# Patient Record
Sex: Male | Born: 1960 | Race: Black or African American | Hispanic: No
Health system: Southern US, Community
[De-identification: ages and names within clinical notes are randomized; demographics above are authoritative.]

## PROBLEM LIST (undated history)

## (undated) DIAGNOSIS — J3089 Other allergic rhinitis: Secondary | ICD-10-CM

## (undated) DIAGNOSIS — E119 Type 2 diabetes mellitus without complications: Secondary | ICD-10-CM

## (undated) DIAGNOSIS — I10 Essential (primary) hypertension: Secondary | ICD-10-CM

## (undated) DIAGNOSIS — K5792 Diverticulitis of intestine, part unspecified, without perforation or abscess without bleeding: Secondary | ICD-10-CM

## (undated) DIAGNOSIS — T7840XA Allergy, unspecified, initial encounter: Secondary | ICD-10-CM

## (undated) DIAGNOSIS — G629 Polyneuropathy, unspecified: Secondary | ICD-10-CM

## (undated) DIAGNOSIS — Z9289 Personal history of other medical treatment: Secondary | ICD-10-CM

## (undated) DIAGNOSIS — E7800 Pure hypercholesterolemia, unspecified: Secondary | ICD-10-CM

## (undated) HISTORY — DX: Type 2 diabetes mellitus without complications: E11.9

## (undated) HISTORY — DX: Diverticulitis of intestine, part unspecified, without perforation or abscess without bleeding: K57.92

## (undated) HISTORY — DX: Other allergic rhinitis: J30.89

## (undated) HISTORY — DX: Essential (primary) hypertension: I10

## (undated) HISTORY — DX: Pure hypercholesterolemia, unspecified: E78.00

## (undated) HISTORY — DX: Polyneuropathy, unspecified: G62.9

## (undated) HISTORY — DX: Allergy, unspecified, initial encounter: T78.40XA

## (undated) HISTORY — DX: Personal history of other medical treatment: Z92.89

---

## 2004-01-29 HISTORY — PX: OTHER SURGICAL HISTORY: SHX169

## 2015-08-16 ENCOUNTER — Encounter: Payer: Self-pay | Admitting: Family

## 2015-08-16 ENCOUNTER — Other Ambulatory Visit (INDEPENDENT_AMBULATORY_CARE_PROVIDER_SITE_OTHER): Payer: Commercial Managed Care - HMO

## 2015-08-16 ENCOUNTER — Other Ambulatory Visit: Payer: Commercial Managed Care - HMO

## 2015-08-16 ENCOUNTER — Ambulatory Visit (INDEPENDENT_AMBULATORY_CARE_PROVIDER_SITE_OTHER): Payer: Commercial Managed Care - HMO | Admitting: Family

## 2015-08-16 VITALS — BP 170/104 | HR 76 | Temp 98.1°F | Ht 70.5 in | Wt 224.0 lb

## 2015-08-16 DIAGNOSIS — Z9189 Other specified personal risk factors, not elsewhere classified: Secondary | ICD-10-CM | POA: Diagnosis not present

## 2015-08-16 DIAGNOSIS — E785 Hyperlipidemia, unspecified: Secondary | ICD-10-CM | POA: Diagnosis not present

## 2015-08-16 DIAGNOSIS — Z23 Encounter for immunization: Secondary | ICD-10-CM

## 2015-08-16 DIAGNOSIS — E1142 Type 2 diabetes mellitus with diabetic polyneuropathy: Secondary | ICD-10-CM

## 2015-08-16 DIAGNOSIS — Z0000 Encounter for general adult medical examination without abnormal findings: Secondary | ICD-10-CM | POA: Diagnosis not present

## 2015-08-16 DIAGNOSIS — E119 Type 2 diabetes mellitus without complications: Secondary | ICD-10-CM | POA: Insufficient documentation

## 2015-08-16 DIAGNOSIS — Z7289 Other problems related to lifestyle: Secondary | ICD-10-CM

## 2015-08-16 DIAGNOSIS — E1149 Type 2 diabetes mellitus with other diabetic neurological complication: Secondary | ICD-10-CM | POA: Diagnosis not present

## 2015-08-16 DIAGNOSIS — I10 Essential (primary) hypertension: Secondary | ICD-10-CM | POA: Insufficient documentation

## 2015-08-16 DIAGNOSIS — H6123 Impacted cerumen, bilateral: Secondary | ICD-10-CM | POA: Diagnosis not present

## 2015-08-16 DIAGNOSIS — Z125 Encounter for screening for malignant neoplasm of prostate: Secondary | ICD-10-CM

## 2015-08-16 DIAGNOSIS — Z794 Long term (current) use of insulin: Secondary | ICD-10-CM

## 2015-08-16 DIAGNOSIS — Z0001 Encounter for general adult medical examination with abnormal findings: Secondary | ICD-10-CM | POA: Diagnosis not present

## 2015-08-16 DIAGNOSIS — E669 Obesity, unspecified: Secondary | ICD-10-CM

## 2015-08-16 DIAGNOSIS — R6889 Other general symptoms and signs: Secondary | ICD-10-CM

## 2015-08-16 LAB — MICROALBUMIN / CREATININE URINE RATIO
Creatinine,U: 153.3 mg/dL
Microalb Creat Ratio: 1.2 mg/g (ref 0.0–30.0)
Microalb, Ur: 1.9 mg/dL (ref 0.0–1.9)

## 2015-08-16 LAB — CBC
HEMATOCRIT: 41 % (ref 39.0–52.0)
HEMOGLOBIN: 13.6 g/dL (ref 13.0–17.0)
MCHC: 33.3 g/dL (ref 30.0–36.0)
MCV: 80.2 fl (ref 78.0–100.0)
Platelets: 241 10*3/uL (ref 150.0–400.0)
RBC: 5.11 Mil/uL (ref 4.22–5.81)
RDW: 14.6 % (ref 11.5–15.5)
WBC: 7.3 10*3/uL (ref 4.0–10.5)

## 2015-08-16 LAB — COMPREHENSIVE METABOLIC PANEL
ALBUMIN: 4.3 g/dL (ref 3.5–5.2)
ALK PHOS: 90 U/L (ref 39–117)
ALT: 15 U/L (ref 0–53)
AST: 14 U/L (ref 0–37)
BILIRUBIN TOTAL: 0.4 mg/dL (ref 0.2–1.2)
BUN: 19 mg/dL (ref 6–23)
CALCIUM: 10.4 mg/dL (ref 8.4–10.5)
CO2: 26 mEq/L (ref 19–32)
Chloride: 106 mEq/L (ref 96–112)
Creatinine, Ser: 1.03 mg/dL (ref 0.40–1.50)
GFR: 96.1 mL/min (ref >60.00)
Glucose, Bld: 148 mg/dL — ABNORMAL HIGH (ref 70–99)
POTASSIUM: 4.1 meq/L (ref 3.5–5.1)
Sodium: 141 mEq/L (ref 135–145)
TOTAL PROTEIN: 7.1 g/dL (ref 6.0–8.3)

## 2015-08-16 LAB — LIPID PANEL
CHOLESTEROL: 153 mg/dL (ref 0–200)
HDL: 34 mg/dL — ABNORMAL LOW (ref >39.00)
LDL Cholesterol: 94 mg/dL (ref 0–99)
NONHDL: 118.74
Total CHOL/HDL Ratio: 4
Triglycerides: 122 mg/dL (ref 0.0–149.0)
VLDL: 24.4 mg/dL (ref 0.0–40.0)

## 2015-08-16 LAB — PSA: PSA: 1.12 ng/mL (ref 0.10–4.00)

## 2015-08-16 NOTE — Assessment & Plan Note (Signed)
Impacted cerumen of bilateral ears noted. Ears are cleansed with docusate sodium and flushed with warm water with wax return noted. Symptoms worse improved upon completion with no complications and procedure was tolerated well.

## 2015-08-16 NOTE — Progress Notes (Signed)
Subjective:    Patient ID: John Dalton, male    DOB: [birth year], 55 y.o.   MRN: [id number]  Chief Complaint  Patient presents with  . Establish Care    colonoscopy in 2006 in [place]. td done 15 years ago. Eye exam done May 2017 in [place]    HPI:  John Dalton is a 55 y.o. male who presents today for an annual wellness visit.   1) Health Maintenance -   Diet - Averages about 2-3 meals per day consisting of a regular diet; some processed and sugary foods; No caffeine intake  Exercise - Walking regularly  2) Preventative Exams / Immunizations:  Dental -- Due for exam   Vision -- Up to date   Health Maintenance  Topic Date Due  . HEMOGLOBIN A1C  05-15-1960  . Hepatitis C Screening  [birth year]  . FOOT EXAM  09/10/1969  . HIV Screening  09/11/1974  . TETANUS/TDAP  09/11/1978  . COLONOSCOPY  09/10/2009  . PNEUMOCOCCAL POLYSACCHARIDE VACCINE (1) 07/29/2016 (Originally 09/10/1961)  . INFLUENZA VACCINE  08/29/2015  . OPHTHALMOLOGY EXAM  05/28/2016     Immunization History  Administered Date(s) Administered  . Tdap 08/16/2015    RISK FACTORS  Tobacco History  Smoking status  . Current Some Day Smoker -- 0.50 packs/day for 20 years  . Types: Cigarettes  Smokeless tobacco  . Never Used     Cardiac risk factors: advanced age (older than 96 for men, 88 for women), diabetes mellitus, dyslipidemia, hypertension and obesity (BMI >= 30 kg/m2).  Depression Screen  Q1: Over the past two weeks, have you felt down, depressed or hopeless? No  Q2: Over the past two weeks, have you felt little interest or pleasure in doing things? No  Have you lost interest or pleasure in daily life? No  Do you often feel hopeless? No  Do you cry easily over simple problems? No  Activities of Daily Living In your present state of health, do you have any difficulty performing the following activities?:  Driving? No Managing money?  No Feeding yourself? No Getting from bed to chair?  No Climbing a flight of stairs? No Preparing food and eating?: No Bathing or showering? No Getting dressed: No Getting to the toilet? No Using the toilet: No Moving around from place to place: No In the past year have you fallen or had a near fall?:No   Home Safety Has smoke detector and wears seat belts.  No excess sun exposure. Are there smokers in your home (other than you)?  No Do you feel safe at home?  Yes  Hearing Difficulties: No Do you often ask people to speak up or repeat themselves? No Do you experience ringing or noises in your ears? No  Do you have difficulty understanding soft or whispered voices? No    Cognitive Testing  Alert? Yes   Normal Appearance? Yes  Oriented to person? Yes  Place? Yes   Time? Yes  Recall of three objects?  Yes  Can perform simple calculations? Yes  Displays appropriate judgment? Yes  Can read the correct time from a watch face? Yes  Do you feel that you have a problem with memory? No  Do you often misplace items? No   Advanced Directives have been discussed with the patient? Yes  Current Physicians/Providers and Suppliers  1.  Marcos Eke, FNP - Internal Medicine  Indicate any recent Medical Services you may have received from other than [facility] providers in the past year (date  may be approximate).  All answers were reviewed with the patient and necessary referrals were made:  Jeanine Luz, FNP   08/16/2015    No Known Allergies   No outpatient prescriptions prior to visit.   No facility-administered medications prior to visit.     Past Medical History  Diagnosis Date  . Diabetes mellitus (HCC)   . Diverticulitis   . Environmental and seasonal allergies   . HTN (hypertension)   . Hypercholesteremia   . 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 Allergy      Past Surgical History  Procedure Laterality Date  . Colon resection       Family History  Problem Relation Age of  Onset  . Diabetes Father   . Hypertension Mother   . Hypertension Sister   . Diabetes Paternal Grandmother   . Diabetes Paternal Grandfather   . Hypertension Maternal Grandmother   . Hypertension Maternal Grandfather   . Hypertension Paternal Grandmother   . Hypertension Paternal Grandfather      Social History   Social History  . Marital Status: Married    Spouse Name: N/A  . Number of Children: 9  . Years of Education: 16   Occupational History  . Retired    Social History Main Topics  . Smoking status: Current Some Day Smoker -- 0.50 packs/day for 20 years    Types: Cigarettes  . Smokeless tobacco: Never Used  . Alcohol Use: No  . Drug Use: No  . Sexual Activity:    Partners: Female   Other Topics Concern  . Not on file   Social History Narrative   Fun: Sports, reading and walking     Review of Systems  Constitutional: Denies fever, chills, fatigue, or significant weight gain/loss. HENT: Head: Denies headache or neck pain Ears: Denies changes in hearing, ringing in ears, earache, drainage Nose: Denies discharge, stuffiness, itching, nosebleed, sinus pain Throat: Denies sore throat, hoarseness, dry mouth, sores, thrush Eyes: Denies loss/changes in vision, pain, redness, blurry/double vision, flashing lights Cardiovascular: Denies chest pain/discomfort, tightness, palpitations, shortness of breath with activity, difficulty lying down, swelling, sudden awakening with shortness of breath Respiratory: Denies shortness of breath, cough, sputum production, wheezing Gastrointestinal: Denies dysphasia, heartburn, change in appetite, nausea, change in bowel habits, rectal bleeding, constipation, diarrhea, yellow skin or eyes Genitourinary: Denies frequency, urgency, burning/pain, blood in urine, incontinence, change in urinary strength. Musculoskeletal: Denies muscle/joint pain, stiffness, back pain, redness or swelling of joints, trauma Skin: Denies rashes, lumps,  itching, dryness, color changes, or hair/nail changes Neurological: Denies dizziness, fainting, seizures, weakness, numbness, tingling, tremor Psychiatric - Denies nervousness, stress, depression or memory loss Endocrine: Denies heat or cold intolerance, sweating, frequent urination, excessive thirst, changes in appetite Hematologic: Denies ease of bruising or bleeding    Objective:     BP 170/104 mmHg  Pulse 76  Temp(Src) 98.1 F (36.7 C) (Oral)  Ht 5' 10.5" (1.791 m)  Wt 224 lb (101.606 kg)  BMI 31.68 kg/m2  SpO2 97% Nursing note and vital signs reviewed.  Physical Exam  Constitutional: He is oriented to person, place, and time. He appears well-developed and well-nourished.  HENT:  Head: Normocephalic.  Right Ear: Hearing, tympanic membrane, external ear and ear canal normal.  Left Ear: Hearing, tympanic membrane, external ear and ear canal normal.  Nose: Nose normal.  Mouth/Throat: Uvula is midline, oropharynx is clear and moist and mucous membranes are normal.  Impacted  cerumen noted in bilateral ears.  Eyes: Conjunctivae and EOM are normal. Pupils are equal, round, and reactive to light.  Neck: Neck supple. No JVD present. No tracheal deviation present. No thyromegaly present.  Cardiovascular: Normal rate, regular rhythm, normal heart sounds and intact distal pulses.   Pulmonary/Chest: Effort normal and breath sounds normal.  Abdominal: Soft. Bowel sounds are normal. He exhibits no distension and no mass. There is no tenderness. There is no rebound and no guarding.  Musculoskeletal: Normal range of motion. He exhibits no edema or tenderness.  Lymphadenopathy:    He has no cervical adenopathy.  Neurological: He is alert and oriented to person, place, and time. He has normal reflexes. No cranial nerve deficit. He exhibits normal muscle tone. Coordination normal.  Diabetic Foot Exam - Simple   Simple Foot Form  Diabetic Foot exam was performed with the following findings:   Yes  08/16/2015 10:05 AM  Visual Inspection  No deformities, no ulcerations, no other skin breakdown bilaterally:  Yes  Sensation Testing  Intact to touch and monofilament testing bilaterally:  Yes  Pulse Check  Posterior Tibialis and Dorsalis pulse intact bilaterally:  Yes   Skin: Skin is warm and dry.  Psychiatric: He has a normal mood and affect. His behavior is normal. Judgment and thought content normal.       Assessment & Plan:   During the course of the visit the patient was educated and counseled about appropriate screening and preventive services including:    Pneumococcal vaccine   Influenza vaccine  Td vaccine  Prostate cancer screening  Colorectal cancer screening  Diabetes screening  Nutrition counseling   Diet review for nutrition referral? Yes ____  Not Indicated _X___   Patient Instructions (the written plan) was given to the patient.  Medicare Attestation I have personally reviewed: The patient's medical and social history Their use of alcohol, tobacco or illicit drugs Their current medications and supplements The patient's functional ability including ADLs,fall risks, home safety risks, cognitive, and hearing and visual impairment Diet and physical activities Evidence for depression or mood disorders  The patient's weight, height, BMI,  have been recorded in the chart.  I have made referrals, counseling, and provided education to the patient based on review of the above and I have provided the patient with a written personalized care plan for preventive services.     Jeanine Luz, FNP   08/16/2015    Problem List Items Addressed This Visit      Cardiovascular and Mediastinum   Hypertension, essential    Hypertension remains elevated above goal 140/90 with current regimen and no adverse side effects. No symptoms of end organ damage or worse headache of life. Encouraged to follow a low-sodium diet. Continue current dosage of amlodipine and  lisinopril. Recheck blood pressure in 2 weeks.      Relevant Medications   amLODipine (NORVASC) 10 MG tablet   atenolol (TENORMIN) 50 MG tablet   lisinopril (PRINIVIL,ZESTRIL) 40 MG tablet   simvastatin (ZOCOR) 20 MG tablet   aspirin EC 81 MG tablet     Endocrine   Type 2 diabetes mellitus (HCC)    Type 2 diabetes appears uncontrolled with current blood sugars ranging above goal with current regimen. Obtain A1c and urine microalbumin. Diabetic foot exam completed today. Continue current dosage of Lantus, metformin, and pH let his own. Maintained on lisinopril and simvastatin for CAD risk reduction. Diabetic eye exam is up-to-date. Follow-up in one month or sooner if needed.  Relevant Medications   LANTUS SOLOSTAR 100 UNIT/ML Solostar Pen   pioglitazone (ACTOS) 30 MG tablet   lisinopril (PRINIVIL,ZESTRIL) 40 MG tablet   simvastatin (ZOCOR) 20 MG tablet   metFORMIN (GLUCOPHAGE) 1000 MG tablet   aspirin EC 81 MG tablet   Other Relevant Orders   Urine Microalbumin w/creat. ratio     Nervous and Auditory   Impacted cerumen of both ears    Impacted cerumen of bilateral ears noted. Ears are cleansed with docusate sodium and flushed with warm water with wax return noted. Symptoms worse improved upon completion with no complications and procedure was tolerated well.        Other   Encounter for routine adult medical exam with abnormal findings    1) Anticipatory Guidance: Discussed importance of wearing a seatbelt while driving and not texting while driving; changing batteries in smoke detector at least once annually; wearing suntan lotion when outside; eating a balanced and moderate diet; getting physical activity at least 30 minutes per day.  2) Immunizations / Screenings / Labs:  Tetanus shot updated today. Declines pneumonia vaccination. All other immunizations are up-to-date per recommendations. Obtain hepatitis C antibody for hepatitis C screening. Obtain PSA for prostate  cancer screening. Obtain A1c and microalbumin. Obtain CBC, CMET, andl lipid profile   Overall well exam with risk factors for cardiovascular disease including type 2 diabetes, hypertension, obesity, and hyperlipidemia. Chronic conditions are currently managed through medication. Continue other healthy lifestyle behaviors and choices. Follow-up prevention exam in 1 year. Follow-up office visit for chronic conditions pending blood work.      Relevant Orders   PSA (Completed)   CBC (Completed)   Comprehensive metabolic panel (Completed)   Lipid panel (Completed)   Medicare annual wellness visit, subsequent - Primary    Reviewed and updated patient's medical, surgical, family and social history. Medications and allergies were also reviewed. Basic screenings for depression, activities of daily living, hearing, cognition and safety were performed. Provider list was updated and health plan was provided to the patient.       Hyperlipidemia    Appears stable and currently maintained on simvastatin. Obtain lipid profile. Continue current dosage of simvastatin pending lipid profile results.      Relevant Medications   amLODipine (NORVASC) 10 MG tablet   atenolol (TENORMIN) 50 MG tablet   lisinopril (PRINIVIL,ZESTRIL) 40 MG tablet   simvastatin (ZOCOR) 20 MG tablet   aspirin EC 81 MG tablet   Obesity    BMI of 31.68. Recommend weight loss of 5-10% of current body weight through nutrition and physical activity.Recommend increasing physical activity to 30 minutes of moderate level activity daily. Encourage nutritional intake that focuses on nutrient dense foods and is moderate, varied, and balanced and is low in saturated fats and processed/sugary foods. Continue to monitor.      Relevant Medications   LANTUS SOLOSTAR 100 UNIT/ML Solostar Pen   pioglitazone (ACTOS) 30 MG tablet   metFORMIN (GLUCOPHAGE) 1000 MG tablet    Other Visit Diagnoses    At risk for colon cancer        Other problems  related to lifestyle        Relevant Orders    Hepatitis C antibody    Need for prophylactic vaccination with combined diphtheria-tetanus-pertussis (DTP) vaccine        Relevant Orders    Tdap vaccine greater than or equal to 7yo IM (Completed)

## 2015-08-16 NOTE — Assessment & Plan Note (Signed)
BMI of 31.68. Recommend weight loss of 5-10% of current body weight through nutrition and physical activity.Recommend increasing physical activity to 30 minutes of moderate level activity daily. Encourage nutritional intake that focuses on nutrient dense foods and is moderate, varied, and balanced and is low in saturated fats and processed/sugary foods. Continue to monitor.

## 2015-08-16 NOTE — Assessment & Plan Note (Addendum)
Hypertension remains elevated above goal 140/90 with current regimen and no adverse side effects. No symptoms of end organ damage or worse headache of life. Encouraged to follow a low-sodium diet. Continue current dosage of amlodipine and lisinopril. Recheck blood pressure in 2 weeks.

## 2015-08-16 NOTE — Progress Notes (Signed)
Pre visit review using our clinic review tool, if applicable. No additional management support is needed unless otherwise documented below in the visit note. 

## 2015-08-16 NOTE — Assessment & Plan Note (Signed)
Appears stable and currently maintained on simvastatin. Obtain lipid profile. Continue current dosage of simvastatin pending lipid profile results.

## 2015-08-16 NOTE — Assessment & Plan Note (Signed)
1) Anticipatory Guidance: Discussed importance of wearing a seatbelt while driving and not texting while driving; changing batteries in smoke detector at least once annually; wearing suntan lotion when outside; eating a balanced and moderate diet; getting physical activity at least 30 minutes per day.  2) Immunizations / Screenings / Labs:  Tetanus shot updated today. Declines pneumonia vaccination. All other immunizations are up-to-date per recommendations. Obtain hepatitis C antibody for hepatitis C screening. Obtain PSA for prostate cancer screening. Obtain A1c and microalbumin. Obtain CBC, CMET, andl lipid profile   Overall well exam with risk factors for cardiovascular disease including type 2 diabetes, hypertension, obesity, and hyperlipidemia. Chronic conditions are currently managed through medication. Continue other healthy lifestyle behaviors and choices. Follow-up prevention exam in 1 year. Follow-up office visit for chronic conditions pending blood work.

## 2015-08-16 NOTE — Assessment & Plan Note (Signed)
Type 2 diabetes appears uncontrolled with current blood sugars ranging above goal with current regimen. Obtain A1c and urine microalbumin. Diabetic foot exam completed today. Continue current dosage of Lantus, metformin, and pH let his own. Maintained on lisinopril and simvastatin for CAD risk reduction. Diabetic eye exam is up-to-date. Follow-up in one month or sooner if needed.

## 2015-08-16 NOTE — Progress Notes (Deleted)
Subjective:    Patient ID: John Dalton, male    DOB: 1960/11/16, 55 y.o.   MRN: [id number]  Chief Complaint  Patient presents with  . Establish Care    colonoscopy in 2006 in [place]. td done 15 years ago. Eye exam done May 2017 in [place]    HPI:  John Dalton is a 55 y.o. male who presents today for an annual wellness visit.   1) Health Maintenance -   Diet -   Exercise -  2) Preventative Exams / Immunizations:  Dental --  Vision --   Health Maintenance  Topic Date Due  . Hepatitis C Screening  01961/10/19  . HIV Screening  09/11/1974  . TETANUS/TDAP  09/11/1978  . COLONOSCOPY  09/10/2009  . INFLUENZA VACCINE  08/29/2015      There is no immunization history on file for this patient.  RISK FACTORS  Tobacco History  Smoking status  . Current Some Day Smoker -- 0.50 packs/day  . Types: Cigarettes  Smokeless tobacco  . Not on file     Cardiac risk factors: {risk factors:510}.  Depression Screen  Q1: Over the past two weeks, have you felt down, depressed or hopeless? No  Q2: Over the past two weeks, have you felt little interest or pleasure in doing things? No  Have you lost interest or pleasure in daily life? No  Do you often feel hopeless? No  Do you cry easily over simple problems? No  Activities of Daily Living In your present state of health, do you have any difficulty performing the following activities?:  Driving? No Managing money?  No Feeding yourself? No Getting from bed to chair? No Climbing a flight of stairs? No Preparing food and eating?: No Bathing or showering? No Getting dressed: No Getting to the toilet? No Using the toilet: No Moving around from place to place: No In the past year have you fallen or had a near fall?:No   Home Safety Has smoke detector and wears seat belts. No firearms. No excess sun exposure. Are there smokers in your home (other than you)?  No Do you feel safe at home?  Yes  Hearing Difficulties: No Do  you often ask people to speak up or repeat themselves? No Do you experience ringing or noises in your ears? No  Do you have difficulty understanding soft or whispered voices? No    Cognitive Testing  Alert? Yes   Normal Appearance? Yes  Oriented to person? Yes  Place? Yes   Time? Yes  Recall of three objects?  Yes  Can perform simple calculations? Yes  Displays appropriate judgment? Yes  Can read the correct time from a watch face? Yes  Do you feel that you have a problem with memory? No  Do you often misplace items? No   Advanced Directives have been discussed with the patient? {yes/no:20286}  Current Physicians/Providers and Suppliers  1.   Indicate any recent Medical Services you may have received from other than [facility] providers in the past year (date may be approximate).  All answers were reviewed with the patient and necessary referrals were made:  Jeanine LuzCalone, Gregory, FNP   08/16/2015    Not on File   No outpatient prescriptions prior to visit.   No facility-administered medications prior to visit.     Past Medical History  Diagnosis Date  . Diabetes mellitus (HCC)   . Diverticulitis   . Environmental and seasonal allergies   . HTN (hypertension)   . Hypercholesteremia   .  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   Family History  Problem Relation Age of Onset  . Diabetes Father   . Hypertension Mother   . Hypertension Sister   . Diabetes Paternal Grandmother   . Diabetes Paternal Grandfather   . Hypertension Maternal Grandmother   . Hypertension Maternal Grandfather   . Hypertension Paternal Grandmother   . Hypertension Paternal Grandfather      Social History   Social History  . Marital Status: Married    Spouse Name: N/A  . Number of Children: N/A  . Years of Education: N/A   Occupational History  . Not on file.   Social History Main Topics  . Smoking status: Current  Some Day Smoker -- 0.50 packs/day    Types: Cigarettes  . Smokeless tobacco: Not on file  . Alcohol Use: No  . Drug Use: No  . Sexual Activity:    Partners: Female   Other Topics Concern  . Not on file   Social History Narrative  . No narrative on file     Review of Systems  Constitutional: Denies fever, chills, fatigue, or significant weight gain/loss. HENT: Head: Denies headache or neck pain Ears: Denies changes in hearing, ringing in ears, earache, drainage Nose: Denies discharge, stuffiness, itching, nosebleed, sinus pain Throat: Denies sore throat, hoarseness, dry mouth, sores, thrush Eyes: Denies loss/changes in vision, pain, redness, blurry/double vision, flashing lights Cardiovascular: Denies chest pain/discomfort, tightness, palpitations, shortness of breath with activity, difficulty lying down, swelling, sudden awakening with shortness of breath Respiratory: Denies shortness of breath, cough, sputum production, wheezing Gastrointestinal: Denies dysphasia, heartburn, change in appetite, nausea, change in bowel habits, rectal bleeding, constipation, diarrhea, yellow skin or eyes Genitourinary: Denies frequency, urgency, burning/pain, blood in urine, incontinence, change in urinary strength. Musculoskeletal: Denies muscle/joint pain, stiffness, back pain, redness or swelling of joints, trauma Skin: Denies rashes, lumps, itching, dryness, color changes, or hair/nail changes Neurological: Denies dizziness, fainting, seizures, weakness, numbness, tingling, tremor Psychiatric - Denies nervousness, stress, depression or memory loss Endocrine: Denies heat or cold intolerance, sweating, frequent urination, excessive thirst, changes in appetite Hematologic: Denies ease of bruising or bleeding    Objective:     BP 170/104 mmHg  Pulse 76  Temp(Src) 98.1 F (36.7 C) (Oral)  Ht 5' 10.5" (1.791 m)  Wt 224 lb (101.606 kg)  BMI 31.68 kg/m2  SpO2 97% Nursing note and vital signs  reviewed.  Physical Exam     Assessment & Plan:   During the course of the visit the patient was educated and counseled about appropriate screening and preventive services including:    {plan:19837}  Diet review for nutrition referral? Yes ____  Not Indicated _X___   Patient Instructions (the written plan) was given to the patient.  Medicare Attestation I have personally reviewed: The patient's medical and social history Their use of alcohol, tobacco or illicit drugs Their current medications and supplements The patient's functional ability including ADLs,fall risks, home safety risks, cognitive, and hearing and visual impairment Diet and physical activities Evidence for depression or mood disorders  The patient's weight, height, BMI,  have been recorded in the chart.  I have made referrals, counseling, and provided education to the patient based on review of the above and I have provided the patient with a written personalized care plan for preventive services.     Jeanine Luz, FNP  08/16/2015    Problem List Items Addressed This Visit    None

## 2015-08-16 NOTE — Assessment & Plan Note (Signed)
Reviewed and updated patient's medical, surgical, family and social history. Medications and allergies were also reviewed. Basic screenings for depression, activities of daily living, hearing, cognition and safety were performed. Provider list was updated and health plan was provided to the patient.  

## 2015-08-16 NOTE — Patient Instructions (Addendum)
Thank you for choosing [facility].  Summary/Instructions:  Please follow up pending A1c results to discuss medications.   Please stop by the lab on the lower level of the building for your blood work. Your results will be released to MyChart (or called to you) after review, usually within 72 hours after test completion. If any changes need to be made, you will be notified at that same time.  1. The lab is open from 7:30am to 5:30 pm Monday-Friday  2. No appointment is necessary  3. Fasting (if needed) is 6-8 hours after food and drink; black coffee  and water are okay    Advance Directive Advance directives are the legal documents that allow you to make choices about your health care and medical treatment if you cannot speak for yourself. Advance directives are a way for you to communicate your wishes to family, friends, and health care providers. The specified people can then convey your decisions about end-of-life care to avoid confusion if you should become unable to communicate. Ideally, the process of discussing and writing advance directives should happen over time rather than making decisions all at once. Advance directives can be modified as your situation changes, and you can change your mind at any time, even after you have signed the advance directives. Each state has its own laws regarding advance directives. You may want to check with your health care provider, attorney, or state representative about the law in your state. Below are some examples of advance directives. LIVING WILL A living will is a set of instructions documenting your wishes about medical care when you cannot care for yourself. It is used if you become:  Terminally ill.  Incapacitated.  Unable to communicate.  Unable to make decisions. Items to consider in your living will include:  The use or non-use of life-sustaining equipment, such as dialysis machines and breathing machines (ventilators).  A  do not resuscitate (DNR) order, which is the instruction not to use cardiopulmonary resuscitation (CPR) if breathing or heartbeat stops.  Tube feeding.  Withholding of food and fluids.  Comfort (palliative) care when the goal becomes comfort rather than a cure.  Organ and tissue donation. A living will does not give instructions about distribution of your money and property if you should pass away. It is advisable to seek the expert advice of a lawyer in drawing up a will regarding your possessions. Decisions about taxes, beneficiaries, and asset distribution will be legally binding. This process can relieve your family and friends of any burdens surrounding disputes or questions that may come up about the allocation of your assets. DO NOT RESUSCITATE (DNR) A do not resuscitate (DNR) order is a request to not have CPR in the event that your heart stops beating or you stop breathing. Unless given other instructions, a health care provider will try to help any patient whose heart has stopped or who has stopped breathing.  HEALTH CARE PROXY AND DURABLE POWER OF ATTORNEY FOR HEALTH CARE A health care proxy is a person (agent) appointed to make medical decisions for you if you cannot. Generally, people choose someone they know well and trust to represent their preferences when they can no longer do so. You should be sure to ask this person for agreement to act as your agent. An agent may have to exercise judgment in the event of a medical decision for which your wishes are not known. The durable power of attorney for health care is the legal document that  names your health care proxy. Once written, it should be:  Signed.  Notarized.  Dated.  Copied.  Witnessed.  Incorporated into your medical record. You may also want to appoint someone to manage your financial affairs if you cannot. This is called a durable power of attorney for finances. It is a separate legal document from the durable power  of attorney for health care. You may choose the same person or someone different from your health care proxy to act as your agent in financial matters.   This information is not intended to replace advice given to you by your health care provider. Make sure you discuss any questions you have with your health care provider.   Document Released: 04/23/2007 Document Revised: 01/19/2013 Document Reviewed: 06/03/2012 Elsevier Interactive Patient Education 2016 [employer].  Health Maintenance, Male A healthy lifestyle and preventative care can promote health and wellness.  Maintain regular health, dental, and eye exams.  Eat a healthy diet. Foods like vegetables, fruits, whole grains, low-fat dairy products, and lean protein foods contain the nutrients you need and are low in calories. Decrease your intake of foods high in solid fats, added sugars, and salt. Get information about a proper diet from your health care provider, if necessary.  Regular physical exercise is one of the most important things you can do for your health. Most adults should get at least 150 minutes of moderate-intensity exercise (any activity that increases your heart rate and causes you to sweat) each week. In addition, most adults need muscle-strengthening exercises on 2 or more days a week.   Maintain a healthy weight. The body mass index (BMI) is a screening tool to identify possible weight problems. It provides an estimate of body fat based on height and weight. Your health care provider can find your BMI and can help you achieve or maintain a healthy weight. For males 20 years and older:  A BMI below 18.5 is considered underweight.  A BMI of 18.5 to 24.9 is normal.  A BMI of 25 to 29.9 is considered overweight.  A BMI of 30 and above is considered obese.  Maintain normal blood lipids and cholesterol by exercising and minimizing your intake of saturated fat. Eat a balanced diet with plenty of fruits and vegetables.  Blood tests for lipids and cholesterol should begin at age 49 and be repeated every 5 years. If your lipid or cholesterol levels are high, you are over age 67, or you are at high risk for heart disease, you may need your cholesterol levels checked more frequently.Ongoing high lipid and cholesterol levels should be treated with medicines if diet and exercise are not working.  If you smoke, find out from your health care provider how to quit. If you do not use tobacco, do not start.  Lung cancer screening is recommended for adults aged 55-80 years who are at high risk for developing lung cancer because of a history of smoking. A yearly low-dose CT scan of the lungs is recommended for people who have at least a 30-pack-year history of smoking and are current smokers or have quit within the past 15 years. A pack year of smoking is smoking an average of 1 pack of cigarettes a day for 1 year (for example, a 30-pack-year history of smoking could mean smoking 1 pack a day for 30 years or 2 packs a day for 15 years). Yearly screening should continue until the smoker has stopped smoking for at least 15 years. Yearly screening should  be stopped for people who develop a health problem that would prevent them from having lung cancer treatment.  If you choose to drink alcohol, do not have more than 2 drinks per day. One drink is considered to be 12 oz (360 mL) of beer, 5 oz (150 mL) of wine, or 1.5 oz (45 mL) of liquor.  Avoid the use of street drugs. Do not share needles with anyone. Ask for help if you need support or instructions about stopping the use of drugs.  High blood pressure causes heart disease and increases the risk of stroke. High blood pressure is more likely to develop in:  People who have blood pressure in the end of the normal range (100-139/85-89 mm Hg).  People who are overweight or obese.  People who are African American.  If you are 73-90 years of age, have your blood pressure checked  every 3-5 years. If you are 43 years of age or older, have your blood pressure checked every year. You should have your blood pressure measured twice--once when you are at a hospital or clinic, and once when you are not at a hospital or clinic. Record the average of the two measurements. To check your blood pressure when you are not at a hospital or clinic, you can use:  An automated blood pressure machine at a pharmacy.  A home blood pressure monitor.  If you are 64-73 years old, ask your health care provider if you should take aspirin to prevent heart disease.  Diabetes screening involves taking a blood sample to check your fasting blood sugar level. This should be done once every 3 years after age 24 if you are at a normal weight and without risk factors for diabetes. Testing should be considered at a younger age or be carried out more frequently if you are overweight and have at least 1 risk factor for diabetes.  Colorectal cancer can be detected and often prevented. Most routine colorectal cancer screening begins at the age of 67 and continues through age 64. However, your health care provider may recommend screening at an earlier age if you have risk factors for colon cancer. On a yearly basis, your health care provider may provide home test kits to check for hidden blood in the stool. A small camera at the end of a tube may be used to directly examine the colon (sigmoidoscopy or colonoscopy) to detect the earliest forms of colorectal cancer. Talk to your health care provider about this at age 57 when routine screening begins. A direct exam of the colon should be repeated every 5-10 years through age 12, unless early forms of precancerous polyps or small growths are found.  People who are at an increased risk for hepatitis B should be screened for this virus. You are considered at high risk for hepatitis B if:  You were born in a country where hepatitis B occurs often. Talk with your health care  provider about which countries are considered high risk.  Your parents were born in a high-risk country and you have not received a shot to protect against hepatitis B (hepatitis B vaccine).  You have HIV or AIDS.  You use needles to inject street drugs.  You live with, or have sex with, someone who has hepatitis B.  You are a man who has sex with other men (MSM).  You get hemodialysis treatment.  You take certain medicines for conditions like cancer, organ transplantation, and autoimmune conditions.  Hepatitis C blood testing is  recommended for all people born from [id number] through 1965 and any individual with known risk factors for hepatitis C.  Healthy men should no longer receive prostate-specific antigen (PSA) blood tests as part of routine cancer screening. Talk to your health care provider about prostate cancer screening.  Testicular cancer screening is not recommended for adolescents or adult males who have no symptoms. Screening includes self-exam, a health care provider exam, and other screening tests. Consult with your health care provider about any symptoms you have or any concerns you have about testicular cancer.  Practice safe sex. Use condoms and avoid high-risk sexual practices to reduce the spread of sexually transmitted infections (STIs).  You should be screened for STIs, including gonorrhea and chlamydia if:  You are sexually active and are younger than 24 years.  You are older than 24 years, and your health care provider tells you that you are at risk for this type of infection.  Your sexual activity has changed since you were last screened, and you are at an increased risk for chlamydia or gonorrhea. Ask your health care provider if you are at risk.  If you are at risk of being infected with HIV, it is recommended that you take a prescription medicine daily to prevent HIV infection. This is called pre-exposure prophylaxis (PrEP). You are considered at risk if:  You  are a man who has sex with other men (MSM).  You are a heterosexual man who is sexually active with multiple partners.  You take drugs by injection.  You are sexually active with a partner who has HIV.  Talk with your health care provider about whether you are at high risk of being infected with HIV. If you choose to begin PrEP, you should first be tested for HIV. You should then be tested every 3 months for as long as you are taking PrEP.  Use sunscreen. Apply sunscreen liberally and repeatedly throughout the day. You should seek shade when your shadow is shorter than you. Protect yourself by wearing long sleeves, pants, a wide-brimmed hat, and sunglasses year round whenever you are outdoors.  Tell your health care provider of new moles or changes in moles, especially if there is a change in shape or color. Also, tell your health care provider if a mole is larger than the size of a pencil eraser.  A one-time screening for abdominal aortic aneurysm (AAA) and surgical repair of large AAAs by ultrasound is recommended for men aged 65-75 years who are current or former smokers.  Stay current with your vaccines (immunizations).   This information is not intended to replace advice given to you by your health care provider. Make sure you discuss any questions you have with your health care provider.   Document Released: 07/13/2007 Document Revised: 02/04/2014 Document Reviewed: 06/11/2010 Elsevier Interactive Patient Education 2016 [employer]  Health Maintenance  Topic Date Due  . HEMOGLOBIN A1C  01/11/1960  . Hepatitis C Screening  01/11/1960  . FOOT EXAM  09/10/1969  . HIV Screening  09/11/1974  . TETANUS/TDAP  09/11/1978  . COLONOSCOPY  09/10/2009  . PNEUMOCOCCAL POLYSACCHARIDE VACCINE (1) 07/29/2016 (Originally 09/10/1961)  . INFLUENZA VACCINE  08/29/2015  . OPHTHALMOLOGY EXAM  05/28/2016

## 2015-08-17 LAB — HEPATITIS C ANTIBODY: HCV Ab: NEGATIVE

## 2015-08-25 ENCOUNTER — Ambulatory Visit: Payer: Self-pay | Admitting: Family Medicine

## 2015-09-11 ENCOUNTER — Encounter: Payer: Self-pay | Admitting: Family

## 2015-09-11 ENCOUNTER — Ambulatory Visit (INDEPENDENT_AMBULATORY_CARE_PROVIDER_SITE_OTHER): Payer: Commercial Managed Care - HMO | Admitting: Family

## 2015-09-11 VITALS — BP 146/84 | HR 77 | Temp 98.4°F | Resp 16 | Ht 70.5 in | Wt 221.0 lb

## 2015-09-11 DIAGNOSIS — E1142 Type 2 diabetes mellitus with diabetic polyneuropathy: Secondary | ICD-10-CM | POA: Diagnosis not present

## 2015-09-11 LAB — POCT GLYCOSYLATED HEMOGLOBIN (HGB A1C): HEMOGLOBIN A1C: 10.1

## 2015-09-11 MED ORDER — METFORMIN HCL 1000 MG PO TABS: 1000.0000 mg | ORAL_TABLET | Freq: Two times a day (BID) | ORAL | 0 refills | Status: DC

## 2015-09-11 MED ORDER — AMLODIPINE BESYLATE 10 MG PO TABS: 10.0000 mg | ORAL_TABLET | Freq: Every day | ORAL | 0 refills | Status: DC

## 2015-09-11 MED ORDER — SIMVASTATIN 20 MG PO TABS: 20.0000 mg | ORAL_TABLET | Freq: Every day | ORAL | 0 refills | Status: DC

## 2015-09-11 MED ORDER — PIOGLITAZONE HCL 30 MG PO TABS: 30.0000 mg | ORAL_TABLET | Freq: Every day | ORAL | 0 refills | Status: DC

## 2015-09-11 MED ORDER — LISINOPRIL 40 MG PO TABS: 40.0000 mg | ORAL_TABLET | Freq: Every day | ORAL | 0 refills | Status: DC

## 2015-09-11 MED ORDER — ATENOLOL 50 MG PO TABS: 50.0000 mg | ORAL_TABLET | Freq: Every day | ORAL | 0 refills | Status: DC

## 2015-09-11 NOTE — Assessment & Plan Note (Signed)
Tell 2 diabetes remains uncontrolled current regimen with blood sugars at home averaging around 240 and in office A1c of 10.1. Change Lantus to 50 units nightly. Continue current dosage of Actos and metformin. Continue to monitor blood sugars at home. Encouraged him to continue to work on nutritional intake that is low in saturated fats and processed/sugary foods. Diabetic foot and eye exam are up-to-date. Maintained on lisinopril and simvastatin for CAD risk reduction. Patient will check on price of insulins for more cost effectiveness.

## 2015-09-11 NOTE — Progress Notes (Signed)
Subjective:    Patient ID: John Dalton, male    DOB: 05/15/1960, 55 y.o.   MRN: [id number]  Chief Complaint  Patient presents with  . Follow-up    needs refill of all medications, diabetes check    HPI:  John Dalton is a 55 y.o. male who  has a past medical history of Allergy; Diabetes mellitus (HCC); Diverticulitis; Environmental and seasonal allergies; History of positive PPD; HTN (hypertension); and Hypercholesteremia. and presents today for a follow up office visit.  1.) Type 2 diabetes - Currently maintained on a pioglitazone, metformin, and Lantus. Reports taking the medication as prescribed and denies adverse side effects or hypoglycemic readings. He is also maintained on gabapentin which adequately controls his neuropathy. Blood sugars at home are labile. Denies any new symptoms of end organ damage. Has not had very good nutrition recently.   Lab Results  Component Value Date   HGBA1C 10.1 09/11/2015     No Known Allergies   Current Outpatient Prescriptions on File Prior to Visit  Medication Sig Dispense Refill  . aspirin EC 81 MG tablet Take 81 mg by mouth daily.    Marland Kitchen. EASY COMFORT PEN NEEDLES 31G X 5 MM MISC     . LANTUS SOLOSTAR 100 UNIT/ML Solostar Pen Inject 50-80 Units into the skin every evening. Units are administered on a sliding scale     No current facility-administered medications on file prior to visit.     Review of Systems  Eyes:       Negative for changes in vision.  Respiratory: Negative for chest tightness and shortness of breath.   Cardiovascular: Negative for chest pain, palpitations and leg swelling.  Endocrine: Negative for polydipsia, polyphagia and polyuria.  Neurological: Positive for numbness. Negative for dizziness, weakness, light-headedness and headaches.      Objective:    BP (!) 146/84 (BP Location: Left Arm, Patient Position: Sitting, Cuff Size: Large)   Pulse 77   Temp 98.4 F (36.9 C) (Oral)   Resp 16   Ht 5' 10.5"  (1.791 m)   Wt 221 lb (100.2 kg)   SpO2 96%   BMI 31.26 kg/m  Nursing note and vital signs reviewed.  Physical Exam  Constitutional: He is oriented to person, place, and time. He appears well-developed and well-nourished. No distress.  Cardiovascular: Normal rate, regular rhythm, normal heart sounds and intact distal pulses.   Pulmonary/Chest: Effort normal and breath sounds normal.  Neurological: He is alert and oriented to person, place, and time.  Skin: Skin is warm and dry.  Psychiatric: He has a normal mood and affect. His behavior is normal. Judgment and thought content normal.       Assessment & Plan:   Problem List Items Addressed This Visit      Endocrine   Type 2 diabetes mellitus (HCC) - Primary    Tell 2 diabetes remains uncontrolled current regimen with blood sugars at home averaging around 240 and in office A1c of 10.1. Change Lantus to 50 units nightly. Continue current dosage of Actos and metformin. Continue to monitor blood sugars at home. Encouraged him to continue to work on nutritional intake that is low in saturated fats and processed/sugary foods. Diabetic foot and eye exam are up-to-date. Maintained on lisinopril and simvastatin for CAD risk reduction. Patient will check on price of insulins for more cost effectiveness.      Relevant Medications   simvastatin (ZOCOR) 20 MG tablet   pioglitazone (ACTOS) 30 MG tablet  metFORMIN (GLUCOPHAGE) 1000 MG tablet   lisinopril (PRINIVIL,ZESTRIL) 40 MG tablet   Other Relevant Orders   POCT glycosylated hemoglobin (Hb A1C) (Completed)    Other Visit Diagnoses   None.      I have changed John Dalton's simvastatin, pioglitazone, metFORMIN, lisinopril, atenolol, and amLODipine. I am also having him maintain his LANTUS SOLOSTAR, EASY COMFORT PEN NEEDLES, and aspirin EC.   Meds ordered this encounter  Medications  . simvastatin (ZOCOR) 20 MG tablet    Sig: Take 1 tablet (20 mg total) by mouth daily.    Dispense:  90  tablet    Refill:  0    Order Specific Question:   Supervising Provider    Answer:   Hillard DankerRAWFORD, ELIZABETH A [[id number]]  . pioglitazone (ACTOS) 30 MG tablet    Sig: Take 1 tablet (30 mg total) by mouth daily.    Dispense:  90 tablet    Refill:  0    Order Specific Question:   Supervising Provider    Answer:   Hillard DankerRAWFORD, ELIZABETH A [[id number]]  . metFORMIN (GLUCOPHAGE) 1000 MG tablet    Sig: Take 1 tablet (1,000 mg total) by mouth 2 (two) times daily with a meal.    Dispense:  180 tablet    Refill:  0    Order Specific Question:   Supervising Provider    Answer:   Hillard DankerRAWFORD, ELIZABETH A [[id number]]  . lisinopril (PRINIVIL,ZESTRIL) 40 MG tablet    Sig: Take 1 tablet (40 mg total) by mouth daily.    Dispense:  90 tablet    Refill:  0    Order Specific Question:   Supervising Provider    Answer:   Hillard DankerRAWFORD, ELIZABETH A [[id number]]  . atenolol (TENORMIN) 50 MG tablet    Sig: Take 1 tablet (50 mg total) by mouth daily.    Dispense:  90 tablet    Refill:  0    Order Specific Question:   Supervising Provider    Answer:   Hillard DankerRAWFORD, ELIZABETH A [[id number]]  . amLODipine (NORVASC) 10 MG tablet    Sig: Take 1 tablet (10 mg total) by mouth daily.    Dispense:  90 tablet    Refill:  0    Order Specific Question:   Supervising Provider    Answer:   Hillard DankerRAWFORD, ELIZABETH A [[id number]]     Follow-up: Return if symptoms worsen or fail to improve.  Jeanine Luzalone, Faheem Ziemann, FNP

## 2015-09-11 NOTE — Patient Instructions (Addendum)
Thank you for choosing [facility].  Summary/Instructions:  Please start taking 50 units of Lantus daily.   Continue to monitor your blood sugars at home. Call back in about 2 weeks or if you have lower blood sugars <80.   Watch your nutrition. Decrease saturated and trans fats, as well as processed and sugary foods.   Insulins: Basaglar, Evaristo Buryresiba, Novolog 70/30,   Other meds: Leverne HumblesBydueron, Trulicity, Farxiga, Jardiance   If your symptoms worsen or fail to improve, please contact our office for further instruction, or in case of emergency go directly to the emergency room at the closest medical facility.

## 2015-09-25 ENCOUNTER — Ambulatory Visit (INDEPENDENT_AMBULATORY_CARE_PROVIDER_SITE_OTHER): Payer: Commercial Managed Care - HMO | Admitting: Family

## 2015-09-25 ENCOUNTER — Encounter: Payer: Self-pay | Admitting: Family

## 2015-09-25 VITALS — BP 114/72 | HR 70 | Temp 98.5°F | Resp 16 | Ht 70.5 in | Wt 222.0 lb

## 2015-09-25 DIAGNOSIS — E1142 Type 2 diabetes mellitus with diabetic polyneuropathy: Secondary | ICD-10-CM | POA: Diagnosis not present

## 2015-09-25 DIAGNOSIS — H00019 Hordeolum externum unspecified eye, unspecified eyelid: Secondary | ICD-10-CM | POA: Insufficient documentation

## 2015-09-25 DIAGNOSIS — R002 Palpitations: Secondary | ICD-10-CM

## 2015-09-25 DIAGNOSIS — H00016 Hordeolum externum left eye, unspecified eyelid: Secondary | ICD-10-CM | POA: Diagnosis not present

## 2015-09-25 MED ORDER — GABAPENTIN 600 MG PO TABS: 600.0000 mg | ORAL_TABLET | Freq: Two times a day (BID) | ORAL | 0 refills | Status: DC

## 2015-09-25 MED ORDER — ERYTHROMYCIN 5 MG/GM OP OINT
1.0000 | TOPICAL_OINTMENT | Freq: Four times a day (QID) | OPHTHALMIC | 0 refills | Status: DC
Start: 2015-09-25 — End: 2016-02-28

## 2015-09-25 MED ORDER — "INSULIN SYRINGE-NEEDLE U-100 31G X 15/64"" 0.5 ML MISC": 2 refills | Status: DC

## 2015-09-25 MED ORDER — INSULIN ASPART PROT & ASPART (70-30 MIX) 100 UNIT/ML ~~LOC~~ SUSP: 6.0000 [IU] | Freq: Two times a day (BID) | SUBCUTANEOUS | 1 refills | Status: DC

## 2015-09-25 NOTE — Assessment & Plan Note (Signed)
Stye of external left eye that has been refractory to warm compresses. Start erythromycin ophthalmic. Concern for possible development of chalazion. If symptoms do not improve with erythromycin consider referral to ophthalmology.

## 2015-09-25 NOTE — Assessment & Plan Note (Signed)
Previously diagnosed with cardiomegaly and heart palpitations. In office EKG shows normal sinus rhythm. Patient is asymptomatic during these episodes. Encouraged to obtain old records of imaging from previous providers. If necessary consider additional echocardiogram to update current status. Continue to monitor pending receiving records.

## 2015-09-25 NOTE — Assessment & Plan Note (Signed)
Type 2 diabetes remains labile with current regimen with most recent A1c of 10.1. Continue current dosage of metformin and Actos. Discontinue Lantus. Start NovoLog 70/30. Titration instructions provided with goal of blood sugars fasting 80-130 and postprandial less than 180. Proper medication usage and side effects discussed. Maintained on simvastatin and lisinopril for CAD risk reduction. Diabetic foot and eye exams are up-to-date. Declines pneumococcal vaccination.

## 2015-09-25 NOTE — Patient Instructions (Addendum)
Thank you for choosing [facility].  SUMMARY AND INSTRUCTIONS:   They will call to schedule your appointment with opthalmology.   Medication:  Please STOP taking the LANTUS.    START taking 6 Units of Novolog 70/30 in the morning and evening.  Erythromycin sent to pharmacy for your eye.   Your prescription(s) have been submitted to your pharmacy or been printed and provided for you. Please take as directed and contact our office if you believe you are having problem(s) with the medication(s) or have any questions.  DIABETES CARE INSTRUCTIONS:  Current A1c:  Lab Results  Component Value Date   HGBA1C 10.1 09/11/2015    A1C Goal: <7.0%  Fasting Blood Sugar Goal: 80-130 Blood sugar check that you take after fasting for at least 8 hours which is usually in the morning before eating.  Post-prandial Blood Sugar Goal: <180 Blood sugar check approximately 1-2 hours after the start of a meal.    Dosing of Novolog 70/30:     Diabetes Prevention Screens:  1.) Ensure you have an annual eye exam by an ophthalmologist or optometrist.   2,) Ensure you have an annual foot exam or when any changes are noted including new onset numbness/tingling or wounds. Check your feet daily!  3.) The American Diabetes Association recommends the Pneumovax vaccination against pneumonia every 5 years.  4.) We will check your kidney function with a urine test on an annual basis.

## 2015-09-25 NOTE — Progress Notes (Signed)
Subjective:    Patient ID: John Dalton, male    DOB: March 29, 1960, 55 y.o.   MRN: [id number]  Chief Complaint  Patient presents with  . Follow-up    need refill of insulin and needles, gabapentin refill    HPI:  John Dalton is a 55 y.o. male who  has a past medical history of Allergy; Diabetes mellitus (HCC); Diverticulitis; Environmental and seasonal allergies; History of positive PPD; HTN (hypertension); and Hypercholesteremia. and presents today for a follow up office visit.   1.) Type 2 diabetes - Currently maintained on by mouth glitazone, metformin, and Lantus. Reports taken medications prescribed and denies adverse side effects or hypoglycemic readings. Blood sugars at home with his fasting blood sugar is 120 and evening blood sugars in the lower 200's. Diabetic foot exam and eye exam are up-to-date.  Lab Results  Component Value Date   HGBA1C 10.1 09/11/2015     2.) Heart palpitations - Previously diagnosed with cardiomegaly when he lived in [place]. Experiencing the associated symptom of heart palpitations that waxed and wane and are not associated with any shortness of breath or chest pain. Has noticed increased frequency of palpitations recently. Denies any modifying factors/treatments that make it better.  3.) Stye  - Associated symptom of a stye located in his left eye has been going on for several months with no significant improvement with over-the-counter medications. Denies changes of vision. Course of the symptoms have remained the same with no significant changes.      No Known Allergies    Outpatient Medications Prior to Visit  Medication Sig Dispense Refill  . amLODipine (NORVASC) 10 MG tablet Take 1 tablet (10 mg total) by mouth daily. 90 tablet 0  . aspirin EC 81 MG tablet Take 81 mg by mouth daily.    Marland Kitchen atenolol (TENORMIN) 50 MG tablet Take 1 tablet (50 mg total) by mouth daily. 90 tablet 0  . EASY COMFORT PEN NEEDLES 31G X 5 MM MISC     . LANTUS  SOLOSTAR 100 UNIT/ML Solostar Pen Inject 50-80 Units into the skin every evening. Units are administered on a sliding scale    . lisinopril (PRINIVIL,ZESTRIL) 40 MG tablet Take 1 tablet (40 mg total) by mouth daily. 90 tablet 0  . metFORMIN (GLUCOPHAGE) 1000 MG tablet Take 1 tablet (1,000 mg total) by mouth 2 (two) times daily with a meal. 180 tablet 0  . pioglitazone (ACTOS) 30 MG tablet Take 1 tablet (30 mg total) by mouth daily. 90 tablet 0  . simvastatin (ZOCOR) 20 MG tablet Take 1 tablet (20 mg total) by mouth daily. 90 tablet 0   No facility-administered medications prior to visit.       Past Surgical History:  Procedure Laterality Date  . Colon resection        Past Medical History:  Diagnosis Date  . Allergy   . Diabetes mellitus (HCC)   . Diverticulitis   . Environmental and seasonal allergies   . 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 HTN (hypertension)   . Hypercholesteremia       Review of Systems  Constitutional: Negative for chills and fever.  Eyes:       Negative for changes in vision.  Respiratory: Negative for chest tightness and shortness of breath.   Cardiovascular: Negative for chest pain, palpitations and leg swelling.  Endocrine: Negative for polydipsia, polyphagia and polyuria.  Neurological: Negative for dizziness, weakness,  light-headedness and headaches.      Objective:    BP 114/72 (BP Location: Left Arm, Patient Position: Sitting, Cuff Size: Normal)   Pulse 70   Temp 98.5 F (36.9 C) (Oral)   Resp 16   Ht 5' 10.5" (1.791 m)   Wt 222 lb (100.7 kg)   SpO2 97%   BMI 31.40 kg/m  Nursing note and vital signs reviewed.  Physical Exam  Constitutional: He is oriented to person, place, and time. He appears well-developed and well-nourished. No distress.  Eyes: Conjunctivae and EOM are normal. Pupils are equal, round, and reactive to light. Lids are everted and swept, no foreign bodies found. Left eye  exhibits hordeolum. Left eye exhibits no chemosis, no discharge and no exudate. No foreign body present in the left eye.  Cardiovascular: Normal rate, regular rhythm, normal heart sounds and intact distal pulses.   Pulmonary/Chest: Effort normal and breath sounds normal.  Neurological: He is alert and oriented to person, place, and time.  Skin: Skin is warm and dry.  Psychiatric: He has a normal mood and affect. His behavior is normal. Judgment and thought content normal.       Assessment & Plan:   Problem List Items Addressed This Visit      Endocrine   Type 2 diabetes mellitus (HCC) - Primary    Type 2 diabetes remains labile with current regimen with most recent A1c of 10.1. Continue current dosage of metformin and Actos. Discontinue Lantus. Start NovoLog 70/30. Titration instructions provided with goal of blood sugars fasting 80-130 and postprandial less than 180. Proper medication usage and side effects discussed. Maintained on simvastatin and lisinopril for CAD risk reduction. Diabetic foot and eye exams are up-to-date. Declines pneumococcal vaccination.      Relevant Medications   insulin aspart protamine- aspart (NOVOLOG MIX 70/30) (70-30) 100 UNIT/ML injection   gabapentin (NEURONTIN) 600 MG tablet     Other   Heart palpitations    Previously diagnosed with cardiomegaly and heart palpitations. In office EKG shows normal sinus rhythm. Patient is asymptomatic during these episodes. Encouraged to obtain old records of imaging from previous providers. If necessary consider additional echocardiogram to update current status. Continue to monitor pending receiving records.      Relevant Orders   EKG 12-Lead (Completed)   Stye external    Stye of external left eye that has been refractory to warm compresses. Start erythromycin ophthalmic. Concern for possible development of chalazion. If symptoms do not improve with erythromycin consider referral to ophthalmology.      Relevant  Medications   erythromycin ophthalmic ointment    Other Visit Diagnoses   None.      I have changed [name]'s gabapentin. I am also having him start on Insulin Syringe-Needle U-100, insulin aspart protamine- aspart, and erythromycin. Additionally, I am having him maintain his LANTUS SOLOSTAR, EASY COMFORT PEN NEEDLES, aspirin EC, simvastatin, pioglitazone, metFORMIN, lisinopril, atenolol, and amLODipine.   Meds ordered this encounter  Medications  . DISCONTD: gabapentin (NEURONTIN) 600 MG tablet    Sig: Take 600 mg by mouth 2 (two) times daily.  . Insulin Syringe-Needle U-100 (BD INSULIN SYRINGE ULTRAFINE) 31G X 15/64" 0.5 ML MISC    Sig: Use 1 needle per injection to inject insulin 1-4 times daily as instructed.    Dispense:  100 each    Refill:  2    Dx: E11.9    Order Specific Question:   Supervising Provider    Answer:   Okey DupreRAWFORD,  ELIZABETH A [[id number]]  . insulin aspart protamine- aspart (NOVOLOG MIX 70/30) (70-30) 100 UNIT/ML injection    Sig: Inject 0.06 mLs (6 Units total) into the skin 2 (two) times daily with a meal.    Dispense:  10 mL    Refill:  1    Order Specific Question:   Supervising Provider    Answer:   Hillard Danker A [[id number]]  . gabapentin (NEURONTIN) 600 MG tablet    Sig: Take 1 tablet (600 mg total) by mouth 2 (two) times daily.    Dispense:  180 tablet    Refill:  0    Order Specific Question:   Supervising Provider    Answer:   Hillard Danker A [[id number]]  . erythromycin ophthalmic ointment    Sig: Place 1 application into the left eye 4 (four) times daily.    Dispense:  3.5 g    Refill:  0    Order Specific Question:   Supervising Provider    Answer:   Hillard Danker A [[id number]]     Follow-up: Return in about 1 month (around 10/26/2015), or if symptoms worsen or fail to improve.  Jeanine Luz, FNP

## 2015-10-12 ENCOUNTER — Telehealth: Payer: Self-pay | Admitting: Emergency Medicine

## 2015-10-12 NOTE — Telephone Encounter (Signed)
error 

## 2015-10-17 ENCOUNTER — Ambulatory Visit (INDEPENDENT_AMBULATORY_CARE_PROVIDER_SITE_OTHER): Payer: Commercial Managed Care - HMO | Admitting: Family

## 2015-10-17 ENCOUNTER — Encounter: Payer: Self-pay | Admitting: Family

## 2015-10-17 DIAGNOSIS — E1142 Type 2 diabetes mellitus with diabetic polyneuropathy: Secondary | ICD-10-CM

## 2015-10-17 MED ORDER — INSULIN NPH ISOPHANE & REGULAR (70-30) 100 UNIT/ML ~~LOC~~ SUSP: SUBCUTANEOUS | 1 refills | Status: DC

## 2015-10-17 NOTE — Assessment & Plan Note (Signed)
Type 2 diabetes with improved control with NovoLog 70/30. Continue current dosage of 30 units in the evening and increase to 35 units in the morning continue to try trade as necessary. Continue current dosage of metformin. Follow-up in 2 months for A1c.

## 2015-10-17 NOTE — Patient Instructions (Signed)
Thank you for choosing [facility].  SUMMARY AND INSTRUCTIONS:  Please continue to take your medication as prescribed.   Titrate your insulin as instructed.   Medication:  Your prescription(s) have been submitted to your pharmacy or been printed and provided for you. Please take as directed and contact our office if you believe you are having problem(s) with the medication(s) or have any questions.   Follow up:  If your symptoms worsen or fail to improve, please contact our office for further instruction, or in case of emergency go directly to the emergency room at the closest medical facility.

## 2015-10-17 NOTE — Progress Notes (Signed)
Subjective:    Patient ID: John Dalton, male    DOB: [birth year], 55 y.o.   MRN: [id number]  Chief Complaint  Patient presents with  . Diabetes    needs insulin    HPI:  John ArgyleLawrence Scadden is a 55 y.o. male who  has a past medical history of Allergy; Diabetes mellitus (HCC); Diverticulitis; Environmental and seasonal allergies; History of positive PPD; HTN (hypertension); and Hypercholesteremia. and presents today For a follow-up office visit.  1.) Type 2 diabetes- Currently maintained on metformin and NovoLog 7030 which she has been titrating independently. NovoLog 70/30 dosage is 30 units in the morning and 30 units in the evening. Blood sugars at home have been with improved control and less than 150 in the morning, however in the evening remains elevated in the mid 200-300 range. Denies changes in vision or new symptoms of end organ damage. Indicates he is not as consistent with following a low carbohydrate diet.  Lab Results  Component Value Date   HGBA1C 10.1 09/11/2015     No Known Allergies    Outpatient Medications Prior to Visit  Medication Sig Dispense Refill  . amLODipine (NORVASC) 10 MG tablet Take 1 tablet (10 mg total) by mouth daily. 90 tablet 0  . aspirin EC 81 MG tablet Take 81 mg by mouth daily.    Marland Kitchen. atenolol (TENORMIN) 50 MG tablet Take 1 tablet (50 mg total) by mouth daily. 90 tablet 0  . EASY COMFORT PEN NEEDLES 31G X 5 MM MISC     . erythromycin ophthalmic ointment Place 1 application into the left eye 4 (four) times daily. 3.5 g 0  . gabapentin (NEURONTIN) 600 MG tablet Take 1 tablet (600 mg total) by mouth 2 (two) times daily. 180 tablet 0  . insulin aspart protamine- aspart (NOVOLOG MIX 70/30) (70-30) 100 UNIT/ML injection Inject 0.06 mLs (6 Units total) into the skin 2 (two) times daily with a meal. 10 mL 1  . Insulin Syringe-Needle U-100 (BD INSULIN SYRINGE ULTRAFINE) 31G X 15/64" 0.5 ML MISC Use 1 needle per injection to inject insulin 1-4 times daily  as instructed. 100 each 2  . LANTUS SOLOSTAR 100 UNIT/ML Solostar Pen Inject 50-80 Units into the skin every evening. Units are administered on a sliding scale    . lisinopril (PRINIVIL,ZESTRIL) 40 MG tablet Take 1 tablet (40 mg total) by mouth daily. 90 tablet 0  . metFORMIN (GLUCOPHAGE) 1000 MG tablet Take 1 tablet (1,000 mg total) by mouth 2 (two) times daily with a meal. 180 tablet 0  . pioglitazone (ACTOS) 30 MG tablet Take 1 tablet (30 mg total) by mouth daily. 90 tablet 0  . simvastatin (ZOCOR) 20 MG tablet Take 1 tablet (20 mg total) by mouth daily. 90 tablet 0   No facility-administered medications prior to visit.      Review of Systems  Eyes:       Negative for changes in vision.  Respiratory: Negative for chest tightness and shortness of breath.   Cardiovascular: Negative for chest pain, palpitations and leg swelling.  Endocrine: Negative for polydipsia, polyphagia and polyuria.  Neurological: Negative for dizziness, weakness, light-headedness and headaches.      Objective:    BP 130/84 (BP Location: Left Arm, Patient Position: Sitting, Cuff Size: Large)   Pulse 70   Temp 98.3 F (36.8 C) (Oral)   Resp 16   Ht 5' 10.5" (1.791 m)   Wt 223 lb (101.2 kg)   SpO2 97%   BMI  31.54 kg/m  Nursing note and vital signs reviewed.  Physical Exam  Constitutional: He is oriented to person, place, and time. He appears well-developed and well-nourished. No distress.  Cardiovascular: Normal rate, regular rhythm, normal heart sounds and intact distal pulses.   Pulmonary/Chest: Effort normal and breath sounds normal.  Neurological: He is alert and oriented to person, place, and time.  Skin: Skin is warm and dry.  Psychiatric: He has a normal mood and affect. His behavior is normal. Judgment and thought content normal.       Assessment & Plan:   Problem List Items Addressed This Visit      Endocrine   Type 2 diabetes mellitus (HCC)    Type 2 diabetes with improved control with  NovoLog 70/30. Continue current dosage of 30 units in the evening and increase to 35 units in the morning continue to try trade as necessary. Continue current dosage of metformin. Follow-up in 2 months for A1c.      Relevant Medications   insulin NPH-regular Human (NOVOLIN 70/30 RELION) (70-30) 100 UNIT/ML injection    Other Visit Diagnoses   None.      I am having Mr. Duffy Bruce start on insulin NPH-regular Human. I am also having him maintain his LANTUS SOLOSTAR, EASY COMFORT PEN NEEDLES, aspirin EC, simvastatin, pioglitazone, metFORMIN, lisinopril, atenolol, amLODipine, Insulin Syringe-Needle U-100, insulin aspart protamine- aspart, gabapentin, and erythromycin.   Meds ordered this encounter  Medications  . insulin NPH-regular Human (NOVOLIN 70/30 RELION) (70-30) 100 UNIT/ML injection    Sig: Inject 35 units in the morning and 30 units at night subcutaneously.    Dispense:  20 mL    Refill:  1    Dx: E11.9    Order Specific Question:   Supervising Provider    Answer:   Hillard Danker A [[id number]]     Follow-up: Return in about 2 months (around 12/17/2015), or if symptoms worsen or fail to improve.  Jeanine Luz, FNP

## 2015-11-18 ENCOUNTER — Encounter (HOSPITAL_COMMUNITY): Payer: Self-pay

## 2015-11-18 ENCOUNTER — Emergency Department (HOSPITAL_COMMUNITY): Payer: Commercial Managed Care - HMO

## 2015-11-18 ENCOUNTER — Emergency Department (HOSPITAL_COMMUNITY)
Admission: EM | Admit: 2015-11-18 | Discharge: 2015-11-18 | Disposition: A | Discharge status: Home / Self Care | Payer: Commercial Managed Care - HMO | Attending: Emergency Medicine | Admitting: Emergency Medicine

## 2015-11-18 DIAGNOSIS — Z794 Long term (current) use of insulin: Secondary | ICD-10-CM | POA: Diagnosis not present

## 2015-11-18 DIAGNOSIS — Z7984 Long term (current) use of oral hypoglycemic drugs: Secondary | ICD-10-CM | POA: Insufficient documentation

## 2015-11-18 DIAGNOSIS — E119 Type 2 diabetes mellitus without complications: Secondary | ICD-10-CM | POA: Insufficient documentation

## 2015-11-18 DIAGNOSIS — F17210 Nicotine dependence, cigarettes, uncomplicated: Secondary | ICD-10-CM | POA: Insufficient documentation

## 2015-11-18 DIAGNOSIS — Z7982 Long term (current) use of aspirin: Secondary | ICD-10-CM | POA: Insufficient documentation

## 2015-11-18 DIAGNOSIS — I10 Essential (primary) hypertension: Secondary | ICD-10-CM | POA: Insufficient documentation

## 2015-11-18 DIAGNOSIS — M25551 Pain in right hip: Secondary | ICD-10-CM | POA: Insufficient documentation

## 2015-11-18 MED ORDER — NAPROXEN 500 MG PO TABS: 500.0000 mg | ORAL_TABLET | Freq: Two times a day (BID) | ORAL | 0 refills | Status: DC

## 2015-11-18 MED ORDER — KETOROLAC TROMETHAMINE 60 MG/2ML IM SOLN
60.0000 mg | Freq: Once | INTRAMUSCULAR | Status: AC
  Administered 2015-11-18: 60 mg via INTRAMUSCULAR
  Filled 2015-11-18: qty 2

## 2015-11-18 NOTE — ED Provider Notes (Signed)
[facility]-EMERGENCY DEPT Provider Note   CSN: [id number] Arrival date & time: 11/18/15  1633  By signing my name below, I, Lennie Muckle, attest that this documentation has been prepared under the direction and in the presence of Buel Ream, PA-C. Electronically Signed: Lennie Muckle, Scribe. 11/18/15. 10:44 PM.   History   Chief Complaint Chief Complaint  Patient presents with  . Hip Pain   The history is provided by the patient. No language interpreter was used.  Hip Pain  Pertinent negatives include no chest pain, no abdominal pain, no headaches and no shortness of breath.   HPI Comments: John Dalton is a 55 y.o. male who presents to the Emergency Department complaining of right hip pain that started 2 weeks ago and it has gotten progressively worse. Rates his pain at a 10/10 today in the ED. This started when he was sitting and watching TV, denies any acute injury or fall. Had a similar episode 7 years ago. Tried taking Tylenol Extra Strength but this did not improve his pain. Was given an injection for sciatica in the past and this improved his pain. Has some pain radiating down the right, anterior leg not passing the knee. Denies numbness, tingling, or fever. Standing makes the pain worse, walking improves the pain. This is intermittent and suddenly worsens at times. It is the worst in the morning and then improves as the day goes on. Denies history of kidney issues.  He is ambulating with a cane on the right side.   Past Medical History:  Diagnosis Date  . Allergy   . Diabetes mellitus (HCC)   . Diverticulitis   . Environmental and seasonal allergies   . 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 HTN (hypertension)   . Hypercholesteremia     Patient Active Problem List   Diagnosis Date Noted  . Heart palpitations 09/25/2015  . Stye external 09/25/2015  . Type 2 diabetes mellitus (HCC) 08/16/2015  . Encounter for routine adult medical exam  with abnormal findings 08/16/2015  . Medicare annual wellness visit, subsequent 08/16/2015  . Hypertension, essential 08/16/2015  . Hyperlipidemia 08/16/2015  . Obesity 08/16/2015  . Impacted cerumen of both ears 08/16/2015    Past Surgical History:  Procedure Laterality Date  . Colon resection         Home Medications    Prior to Admission medications   Medication Sig Start Date End Date Taking? Authorizing Provider  amLODipine (NORVASC) 10 MG tablet Take 1 tablet (10 mg total) by mouth daily. 09/11/15   Veryl Speak, FNP  aspirin EC 81 MG tablet Take 81 mg by mouth daily.    Historical Provider, MD  atenolol (TENORMIN) 50 MG tablet Take 1 tablet (50 mg total) by mouth daily. 09/11/15   Veryl Speak, FNP  EASY COMFORT PEN NEEDLES 31G X 5 MM MISC  07/31/15   Historical Provider, MD  erythromycin ophthalmic ointment Place 1 application into the left eye 4 (four) times daily. 09/25/15   Veryl Speak, FNP  gabapentin (NEURONTIN) 600 MG tablet Take 1 tablet (600 mg total) by mouth 2 (two) times daily. 09/25/15   Veryl Speak, FNP  insulin aspart protamine- aspart (NOVOLOG MIX 70/30) (70-30) 100 UNIT/ML injection Inject 0.06 mLs (6 Units total) into the skin 2 (two) times daily with a meal. 09/25/15   Veryl Speak, FNP  insulin NPH-regular Human (NOVOLIN 70/30 RELION) (70-30) 100 UNIT/ML injection Inject  35 units in the morning and 30 units at night subcutaneously. 10/17/15   Veryl Speak, FNP  Insulin Syringe-Needle U-100 (BD INSULIN SYRINGE ULTRAFINE) 31G X 15/64" 0.5 ML MISC Use 1 needle per injection to inject insulin 1-4 times daily as instructed. 09/25/15   Veryl Speak, FNP  LANTUS SOLOSTAR 100 UNIT/ML Solostar Pen Inject 50-80 Units into the skin every evening. Units are administered on a sliding scale 08/06/15   Historical Provider, MD  lisinopril (PRINIVIL,ZESTRIL) 40 MG tablet Take 1 tablet (40 mg total) by mouth daily. 09/11/15   Veryl Speak, FNP  metFORMIN  (GLUCOPHAGE) 1000 MG tablet Take 1 tablet (1,000 mg total) by mouth 2 (two) times daily with a meal. 09/11/15   Veryl Speak, FNP  naproxen (NAPROSYN) 500 MG tablet Take 1 tablet (500 mg total) by mouth 2 (two) times daily. 11/18/15   Teriyah Purington M Nilaya Bouie, PA-C  pioglitazone (ACTOS) 30 MG tablet Take 1 tablet (30 mg total) by mouth daily. 09/11/15   Veryl Speak, FNP  simvastatin (ZOCOR) 20 MG tablet Take 1 tablet (20 mg total) by mouth daily. 09/11/15   Veryl Speak, FNP    Family History Family History  Problem Relation Age of Onset  . Diabetes Father   . Hypertension Mother   . Hypertension Sister   . Diabetes Paternal Grandmother   . Hypertension Paternal Grandmother   . Diabetes Paternal Grandfather   . Hypertension Paternal Grandfather   . Hypertension Maternal Grandmother   . Hypertension Maternal Grandfather     Social History Social History  Substance Use Topics  . Smoking status: Current Some Day Smoker    Packs/day: 0.00    Years: 20.00    Types: Cigarettes  . Smokeless tobacco: Never Used  . Alcohol use No     Allergies   Review of patient's allergies indicates no known allergies.   Review of Systems Review of Systems  Constitutional: Negative for chills and fever.  HENT: Negative for facial swelling and sore throat.   Respiratory: Negative for shortness of breath.   Cardiovascular: Negative for chest pain.  Gastrointestinal: Negative for abdominal pain, nausea and vomiting.  Genitourinary: Negative for dysuria.  Musculoskeletal: Positive for arthralgias. Negative for back pain.       Right hip pain  Skin: Negative for rash and wound.  Neurological: Negative for numbness and headaches.       Pain shooting down the right leg, no tingling  Psychiatric/Behavioral: The patient is not nervous/anxious.   All other systems reviewed and are negative.    Physical Exam Updated Vital Signs BP 144/85 (BP Location: Left Arm)   Pulse 80   Temp 98.3 F (36.8  C) (Oral)   Resp 16   SpO2 96%   Physical Exam  Constitutional: He appears well-developed and well-nourished. No distress.  HENT:  Head: Normocephalic and atraumatic.  Mouth/Throat: Oropharynx is clear and moist. No oropharyngeal exudate.  Eyes: Conjunctivae are normal. Pupils are equal, round, and reactive to light. Right eye exhibits no discharge. Left eye exhibits no discharge. No scleral icterus.  Neck: Normal range of motion. Neck supple. No thyromegaly present.  Cardiovascular: Normal rate, regular rhythm, normal heart sounds and intact distal pulses.  Exam reveals no gallop and no friction rub.   No murmur heard. Pulmonary/Chest: Effort normal and breath sounds normal. No stridor. No respiratory distress. He has no wheezes. He has no rales.  Abdominal: Soft. Bowel sounds are normal. He exhibits no distension. There  is no tenderness. There is no rebound and no guarding.  Musculoskeletal: He exhibits no edema.  Pain to palpation over the right greater trochanter; no tenderness to palpation to the spine  Lymphadenopathy:    He has no cervical adenopathy.  Neurological: He is alert. No cranial nerve deficit. Coordination normal.  Strength is 5/5, sensation and motor are normal; 2+ patellar reflexes  Skin: Skin is warm and dry. No rash noted. He is not diaphoretic. No pallor.  Psychiatric: He has a normal mood and affect.  Nursing note and vitals reviewed.    ED Treatments / Results  Labs (all labs ordered are listed, but only abnormal results are displayed) Labs Reviewed - No data to display  EKG  EKG Interpretation None       Radiology Dg Hip Unilat  With Pelvis 2-3 Views Right  Result Date: 11/18/2015 CLINICAL DATA:  Right hip pain x 2 weeks with no injury; pain radiates to mid thigh; pt states that he was told that he had a sciatica 7 years ago but it went away; EXAM: DG HIP (WITH OR WITHOUT PELVIS) 2-3V RIGHT COMPARISON:  None. FINDINGS: There is no evidence of hip  fracture or dislocation. There is no evidence of arthropathy or other focal bone abnormality. IMPRESSION: Negative. Electronically Signed   By: Norva PavlovElizabeth  Brown M.D.   On: 11/18/2015 17:19    Procedures Procedures (including critical care time)  Medications Ordered in ED Medications  ketorolac (TORADOL) injection 60 mg (60 mg Intramuscular Given 11/18/15 1857)     Initial Impression / Assessment and Plan / ED Course  I have reviewed the triage vital signs and the nursing notes.  Pertinent labs & imaging results that were available during my care of the patient were reviewed by me and considered in my medical decision making (see chart for details).  Clinical Course   Patient X-Ray negative for obvious fracture or dislocation.  Suspect greater trochanteric bursitis. Patient given injection of Toradol while in ED with significant improvement of pain. Physical therapy recommended and discussed if symptoms are not improving and the patient should follow up with orthopedics. Provided patient with NSAIDs for pain relief as noted above. Icing discussed for pain relief. Patient will be discharged home & is agreeable with above plan. Return precautions discussed. Pt appears safe for discharge.  I personally performed the services described in this documentation, which was scribed in my presence. The recorded information has been reviewed and is accurate.    Final Clinical Impressions(s) / ED Diagnoses   Final diagnoses:  Right hip pain    New Prescriptions Discharge Medication List as of 11/18/2015  7:11 PM    START taking these medications   Details  naproxen (NAPROSYN) 500 MG tablet Take 1 tablet (500 mg total) by mouth 2 (two) times daily., Starting Sat 11/18/2015, Print         Emi Holeslexandra M Roselynn Whitacre, PA-C 11/18/15 2245    Rolan BuccoMelanie Belfi, MD 11/19/15 0021

## 2015-11-18 NOTE — Progress Notes (Signed)
Right hip pain times 2 weeks. No injury,.

## 2015-11-18 NOTE — ED Triage Notes (Addendum)
Per EMS, Pt from home, c/o R hip pain x 2 weeks.  Pain score 10/10.  Denies new injury.  Sts he was sitting and watching tv when the pani began.  Pt reports similar episode x 7 years ago.  Pt reports taking extra strength Tylenol w/o relief.

## 2015-11-18 NOTE — Discharge Instructions (Signed)
Medications: Naprosyn  Treatment: Take Naprosyn twice daily for 1 week. Use ice 3-4 times daily alternating 20 minutes on, 20 minutes off.  Follow-up: Please follow-up with Dr. Roda ShuttersXu, an orthopedic doctor, further evaluation and treatment if your symptoms are not improving. Please return to emergency department if you develop any new or worsening symptoms including fever.

## 2015-11-18 NOTE — ED Notes (Signed)
Bed: WLPT1 Expected date:  Expected time:  Means of arrival:  Comments: 

## 2015-11-18 NOTE — ED Notes (Signed)
Bed: WTR5 Expected date:  Expected time:  Means of arrival:  Comments: 

## 2015-12-11 ENCOUNTER — Ambulatory Visit (INDEPENDENT_AMBULATORY_CARE_PROVIDER_SITE_OTHER): Payer: Commercial Managed Care - HMO | Admitting: Family

## 2015-12-11 ENCOUNTER — Encounter: Payer: Self-pay | Admitting: Family

## 2015-12-11 VITALS — BP 140/80 | HR 87 | Temp 98.5°F | Resp 18 | Ht 70.5 in | Wt 225.0 lb

## 2015-12-11 DIAGNOSIS — L02212 Cutaneous abscess of back [any part, except buttock]: Secondary | ICD-10-CM

## 2015-12-11 DIAGNOSIS — E1142 Type 2 diabetes mellitus with diabetic polyneuropathy: Secondary | ICD-10-CM

## 2015-12-11 MED ORDER — LISINOPRIL 40 MG PO TABS: 40.0000 mg | ORAL_TABLET | Freq: Every day | ORAL | 0 refills | Status: DC

## 2015-12-11 MED ORDER — PIOGLITAZONE HCL 30 MG PO TABS: 30.0000 mg | ORAL_TABLET | Freq: Every day | ORAL | 0 refills | Status: DC

## 2015-12-11 MED ORDER — ACETAMINOPHEN-CODEINE #3 300-30 MG PO TABS: 1.0000 | ORAL_TABLET | Freq: Four times a day (QID) | ORAL | 0 refills | Status: DC | PRN

## 2015-12-11 MED ORDER — METFORMIN HCL 1000 MG PO TABS: 1000.0000 mg | ORAL_TABLET | Freq: Two times a day (BID) | ORAL | 0 refills | Status: DC

## 2015-12-11 MED ORDER — INSULIN NPH ISOPHANE & REGULAR (70-30) 100 UNIT/ML ~~LOC~~ SUSP: SUBCUTANEOUS | 1 refills | Status: DC

## 2015-12-11 MED ORDER — DOXYCYCLINE HYCLATE 100 MG PO TABS: 100.0000 mg | ORAL_TABLET | Freq: Two times a day (BID) | ORAL | 0 refills | Status: DC

## 2015-12-11 MED ORDER — AMLODIPINE BESYLATE 10 MG PO TABS: 10.0000 mg | ORAL_TABLET | Freq: Every day | ORAL | 0 refills | Status: DC

## 2015-12-11 MED ORDER — ATENOLOL 50 MG PO TABS
50.0000 mg | ORAL_TABLET | Freq: Every day | ORAL | 0 refills | Status: DC
Start: 2015-12-11 — End: 2016-02-28

## 2015-12-11 MED ORDER — SIMVASTATIN 20 MG PO TABS
20.0000 mg | ORAL_TABLET | Freq: Every day | ORAL | 0 refills | Status: DC
Start: 2015-12-11 — End: 2016-02-28

## 2015-12-11 MED ORDER — GABAPENTIN 600 MG PO TABS: 600.0000 mg | ORAL_TABLET | Freq: Two times a day (BID) | ORAL | 0 refills | Status: DC

## 2015-12-11 NOTE — Patient Instructions (Signed)
Thank you for choosing [facility].  SUMMARY AND INSTRUCTIONS:  Keep dressing in place for 24 hours.   Start doxycycline for the antbioitic.  Keep clean with soap and water.  Ok with some drainage which is to be expected.   Cover for protection.  Follow up if symptoms of infection develop.   Medication:  Your prescription(s) have been submitted to your pharmacy or been printed and provided for you. Please take as directed and contact our office if you believe you are having problem(s) with the medication(s) or have any questions.  Follow up:  If your symptoms worsen or fail to improve, please contact our office for further instruction, or in case of emergency go directly to the emergency room at the closest medical facility.

## 2015-12-11 NOTE — Assessment & Plan Note (Signed)
Abscess of back successfully incised and drained without complication and tolerated well by patient. Post care instructions provided and to monitor for signs of infection. Start doxycycline. Start Tylenol #3 as needed for discomfort. Follow up in 1 week or sooner if needed.

## 2015-12-11 NOTE — Progress Notes (Signed)
Subjective:    Patient ID: John Dalton, male    DOB: 1960/06/22, 55 y.o.   MRN: [id number]  Chief Complaint  Patient presents with  . Medication Refill    refills on all medication, x2 days has had what he thinks is a bug bite on back that is causing severe pain    HPI:  John ArgyleLawrence Somes is a 55 y.o. male who  has a past medical history of Allergy; Diabetes mellitus (HCC); Diverticulitis; Environmental and seasonal allergies; History of positive PPD; HTN (hypertension); and Hypercholesteremia. and presents today for an acute office visit.   1.) Lesion on back - This is a new problem. Associated symptom of pain located on the left side of his middle back has been going on for about 2 days with a pain severity level that is deemed "excrutiating." Modifying factors include peroxide, Alcohol, and benedyrl which have not helped. Pain is described as burning. Course of the symptoms is worsening.    No Known Allergies    Outpatient Medications Prior to Visit  Medication Sig Dispense Refill  . aspirin EC 81 MG tablet Take 81 mg by mouth daily.    Marland Kitchen. EASY COMFORT PEN NEEDLES 31G X 5 MM MISC     . erythromycin ophthalmic ointment Place 1 application into the left eye 4 (four) times daily. 3.5 g 0  . insulin aspart protamine- aspart (NOVOLOG MIX 70/30) (70-30) 100 UNIT/ML injection Inject 0.06 mLs (6 Units total) into the skin 2 (two) times daily with a meal. 10 mL 1  . Insulin Syringe-Needle U-100 (BD INSULIN SYRINGE ULTRAFINE) 31G X 15/64" 0.5 ML MISC Use 1 needle per injection to inject insulin 1-4 times daily as instructed. 100 each 2  . LANTUS SOLOSTAR 100 UNIT/ML Solostar Pen Inject 50-80 Units into the skin every evening. Units are administered on a sliding scale    . amLODipine (NORVASC) 10 MG tablet Take 1 tablet (10 mg total) by mouth daily. 90 tablet 0  . atenolol (TENORMIN) 50 MG tablet Take 1 tablet (50 mg total) by mouth daily. 90 tablet 0  . gabapentin (NEURONTIN) 600 MG tablet  Take 1 tablet (600 mg total) by mouth 2 (two) times daily. 180 tablet 0  . insulin NPH-regular Human (NOVOLIN 70/30 RELION) (70-30) 100 UNIT/ML injection Inject 35 units in the morning and 30 units at night subcutaneously. 20 mL 1  . lisinopril (PRINIVIL,ZESTRIL) 40 MG tablet Take 1 tablet (40 mg total) by mouth daily. 90 tablet 0  . metFORMIN (GLUCOPHAGE) 1000 MG tablet Take 1 tablet (1,000 mg total) by mouth 2 (two) times daily with a meal. 180 tablet 0  . pioglitazone (ACTOS) 30 MG tablet Take 1 tablet (30 mg total) by mouth daily. 90 tablet 0  . simvastatin (ZOCOR) 20 MG tablet Take 1 tablet (20 mg total) by mouth daily. 90 tablet 0  . naproxen (NAPROSYN) 500 MG tablet Take 1 tablet (500 mg total) by mouth 2 (two) times daily. 14 tablet 0   No facility-administered medications prior to visit.       Past Surgical History:  Procedure Laterality Date  . Colon resection        Past Medical History:  Diagnosis Date  . Allergy   . Diabetes mellitus (HCC)   . Diverticulitis   . Environmental and seasonal allergies   . 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 HTN (hypertension)   . Hypercholesteremia  Review of Systems  Constitutional: Negative for chills and fever.  Skin:       Positive for abscess  Neurological: Negative for weakness and numbness.      Objective:    BP 140/80 (BP Location: Left Arm, Patient Position: Sitting, Cuff Size: Large)   Pulse 87   Temp 98.5 F (36.9 C) (Oral)   Resp 18   Ht 5' 10.5" (1.791 m)   Wt 225 lb (102.1 kg)   SpO2 97%   BMI 31.83 kg/m  Nursing note and vital signs reviewed.  Physical Exam  Constitutional: He is oriented to person, place, and time. He appears well-developed and well-nourished. No distress.  Cardiovascular: Normal rate, regular rhythm, normal heart sounds and intact distal pulses.   Pulmonary/Chest: Effort normal and breath sounds normal.  Neurological: He is alert and  oriented to person, place, and time.  Skin: Skin is warm and dry.     Psychiatric: He has a normal mood and affect. His behavior is normal. Judgment and thought content normal.   Procedure - Incision and draining of abscess Following discussion of risks and benefits including alternative treatments, patient provided verbal consent for incision and drainage of the abscess. The abscess on the left side of the back was identified. A time out was performed prior to starting procedure. The site was cleansed with betadine applied in concentric circles from the center of the site outwards. Skin anesthesia was applied using cold spray followed by injection of 2% lidocaine with epi. Upon successful anesthesia an incision was made with a #10 scalpel with immediate return of white drainage. The site was drained and explored for loculations. The wound was copiously irrigated with saline solution and a dressing was applied. The procedure had minimal blood loss and was tolerated well. Post care instructions provided.     Assessment & Plan:   Problem List Items Addressed This Visit      Endocrine   Type 2 diabetes mellitus (HCC)   Relevant Medications   gabapentin (NEURONTIN) 600 MG tablet   insulin NPH-regular Human (NOVOLIN 70/30 RELION) (70-30) 100 UNIT/ML injection   lisinopril (PRINIVIL,ZESTRIL) 40 MG tablet   metFORMIN (GLUCOPHAGE) 1000 MG tablet   pioglitazone (ACTOS) 30 MG tablet   simvastatin (ZOCOR) 20 MG tablet     Other   Abscess of back - Primary    Abscess of back successfully incised and drained without complication and tolerated well by patient. Post care instructions provided and to monitor for signs of infection. Start doxycycline. Start Tylenol #3 as needed for discomfort. Follow up in 1 week or sooner if needed.       Relevant Medications   doxycycline (VIBRA-TABS) 100 MG tablet   acetaminophen-codeine (TYLENOL #3) 300-30 MG tablet       I have discontinued Mr. Chevez's  naproxen. I am also having him start on doxycycline and acetaminophen-codeine. Additionally, I am having him maintain his LANTUS SOLOSTAR, EASY COMFORT PEN NEEDLES, aspirin EC, Insulin Syringe-Needle U-100, insulin aspart protamine- aspart, erythromycin, amLODipine, atenolol, gabapentin, insulin NPH-regular Human, lisinopril, metFORMIN, pioglitazone, and simvastatin.   Meds ordered this encounter  Medications  . doxycycline (VIBRA-TABS) 100 MG tablet    Sig: Take 1 tablet (100 mg total) by mouth 2 (two) times daily.    Dispense:  20 tablet    Refill:  0    Order Specific Question:   Supervising Provider    Answer:   Hillard DankerRAWFORD, ELIZABETH A [[id number]]  . acetaminophen-codeine (TYLENOL #3) 300-30 MG tablet  Sig: Take 1-2 tablets by mouth every 6 (six) hours as needed for moderate pain.    Dispense:  30 tablet    Refill:  0    Order Specific Question:   Supervising Provider    Answer:   Hillard Danker A [[id number]]  . amLODipine (NORVASC) 10 MG tablet    Sig: Take 1 tablet (10 mg total) by mouth daily.    Dispense:  90 tablet    Refill:  0    Order Specific Question:   Supervising Provider    Answer:   Hillard Danker A [[id number]]  . atenolol (TENORMIN) 50 MG tablet    Sig: Take 1 tablet (50 mg total) by mouth daily.    Dispense:  90 tablet    Refill:  0    Order Specific Question:   Supervising Provider    Answer:   Hillard Danker A [[id number]]  . gabapentin (NEURONTIN) 600 MG tablet    Sig: Take 1 tablet (600 mg total) by mouth 2 (two) times daily.    Dispense:  180 tablet    Refill:  0    Order Specific Question:   Supervising Provider    Answer:   Hillard Danker A [[id number]]  . insulin NPH-regular Human (NOVOLIN 70/30 RELION) (70-30) 100 UNIT/ML injection    Sig: Inject 35 units in the morning and 30 units at night subcutaneously.    Dispense:  20 mL    Refill:  1    Dx: E11.9    Order Specific Question:   Supervising Provider    Answer:   Hillard Danker A [[id number]]  .  lisinopril (PRINIVIL,ZESTRIL) 40 MG tablet    Sig: Take 1 tablet (40 mg total) by mouth daily.    Dispense:  90 tablet    Refill:  0    Order Specific Question:   Supervising Provider    Answer:   Hillard Danker A [[id number]]  . metFORMIN (GLUCOPHAGE) 1000 MG tablet    Sig: Take 1 tablet (1,000 mg total) by mouth 2 (two) times daily with a meal.    Dispense:  180 tablet    Refill:  0    Order Specific Question:   Supervising Provider    Answer:   Hillard Danker A [[id number]]  . pioglitazone (ACTOS) 30 MG tablet    Sig: Take 1 tablet (30 mg total) by mouth daily.    Dispense:  90 tablet    Refill:  0    Order Specific Question:   Supervising Provider    Answer:   Hillard Danker A [[id number]]  . simvastatin (ZOCOR) 20 MG tablet    Sig: Take 1 tablet (20 mg total) by mouth daily.    Dispense:  90 tablet    Refill:  0    Order Specific Question:   Supervising Provider    Answer:   Hillard Danker A [[id number]]     Follow-up: Return in about 1 week (around 12/18/2015).  Jeanine Luz, FNP

## 2015-12-29 ENCOUNTER — Other Ambulatory Visit (INDEPENDENT_AMBULATORY_CARE_PROVIDER_SITE_OTHER): Payer: Commercial Managed Care - HMO

## 2015-12-29 ENCOUNTER — Ambulatory Visit (INDEPENDENT_AMBULATORY_CARE_PROVIDER_SITE_OTHER): Payer: Commercial Managed Care - HMO | Admitting: Family

## 2015-12-29 ENCOUNTER — Encounter: Payer: Self-pay | Admitting: Family

## 2015-12-29 VITALS — BP 164/98 | HR 82 | Temp 98.1°F | Ht 70.5 in | Wt 223.2 lb

## 2015-12-29 DIAGNOSIS — E1142 Type 2 diabetes mellitus with diabetic polyneuropathy: Secondary | ICD-10-CM

## 2015-12-29 DIAGNOSIS — N521 Erectile dysfunction due to diseases classified elsewhere: Secondary | ICD-10-CM | POA: Diagnosis not present

## 2015-12-29 DIAGNOSIS — N529 Male erectile dysfunction, unspecified: Secondary | ICD-10-CM | POA: Insufficient documentation

## 2015-12-29 LAB — HEMOGLOBIN A1C: HEMOGLOBIN A1C: 8.8 % — AB (ref 4.6–6.5)

## 2015-12-29 MED ORDER — SILDENAFIL CITRATE 100 MG PO TABS: 50.0000 mg | ORAL_TABLET | Freq: Every day | ORAL | 0 refills | Status: DC | PRN

## 2015-12-29 NOTE — Progress Notes (Signed)
Subjective:    Patient ID: John ArgyleLawrence Pursifull, male    DOB: April 03, 1960, 55 y.o.   MRN: [id number]  Chief Complaint  Patient presents with  . Follow-up    A1C,     HPI:  John Dalton is a 55 y.o. male who  has a past medical history of Allergy; Diabetes mellitus (HCC); Diverticulitis; Environmental and seasonal allergies; History of positive PPD; HTN (hypertension); and Hypercholesteremia. and presents today for a follow up office visit.  1.) Diabetes -  Currently maintained on metformin, pioglitzone, and Insulin 70/30 with 40 units twice daily. Reports taking the medicaiton as prescribed and denies adverse side effects. Does have the side effect of erectile dysfunction with difficulty obtaining an erection. Denies any changes in vision or new symptoms of end organ damage. No low blood sugars.  Lab Results  Component Value Date   HGBA1C 8.8 (H) 12/29/2015    No Known Allergies    Outpatient Medications Prior to Visit  Medication Sig Dispense Refill  . acetaminophen-codeine (TYLENOL #3) 300-30 MG tablet Take 1-2 tablets by mouth every 6 (six) hours as needed for moderate pain. 30 tablet 0  . amLODipine (NORVASC) 10 MG tablet Take 1 tablet (10 mg total) by mouth daily. 90 tablet 0  . aspirin EC 81 MG tablet Take 81 mg by mouth daily.    Marland Kitchen. atenolol (TENORMIN) 50 MG tablet Take 1 tablet (50 mg total) by mouth daily. 90 tablet 0  . EASY COMFORT PEN NEEDLES 31G X 5 MM MISC     . gabapentin (NEURONTIN) 600 MG tablet Take 1 tablet (600 mg total) by mouth 2 (two) times daily. 180 tablet 0  . insulin NPH-regular Human (NOVOLIN 70/30 RELION) (70-30) 100 UNIT/ML injection Inject 35 units in the morning and 30 units at night subcutaneously. 20 mL 1  . Insulin Syringe-Needle U-100 (BD INSULIN SYRINGE ULTRAFINE) 31G X 15/64" 0.5 ML MISC Use 1 needle per injection to inject insulin 1-4 times daily as instructed. 100 each 2  . lisinopril (PRINIVIL,ZESTRIL) 40 MG tablet Take 1 tablet (40 mg total) by  mouth daily. 90 tablet 0  . metFORMIN (GLUCOPHAGE) 1000 MG tablet Take 1 tablet (1,000 mg total) by mouth 2 (two) times daily with a meal. 180 tablet 0  . pioglitazone (ACTOS) 30 MG tablet Take 1 tablet (30 mg total) by mouth daily. 90 tablet 0  . simvastatin (ZOCOR) 20 MG tablet Take 1 tablet (20 mg total) by mouth daily. 90 tablet 0  . doxycycline (VIBRA-TABS) 100 MG tablet Take 1 tablet (100 mg total) by mouth 2 (two) times daily. 20 tablet 0  . LANTUS SOLOSTAR 100 UNIT/ML Solostar Pen Inject 50-80 Units into the skin every evening. Units are administered on a sliding scale    . erythromycin ophthalmic ointment Place 1 application into the left eye 4 (four) times daily. (Patient not taking: Reported on 12/29/2015) 3.5 g 0  . insulin aspart protamine- aspart (NOVOLOG MIX 70/30) (70-30) 100 UNIT/ML injection Inject 0.06 mLs (6 Units total) into the skin 2 (two) times daily with a meal. (Patient not taking: Reported on 12/29/2015) 10 mL 1   No facility-administered medications prior to visit.      Review of Systems  Eyes:       Negative for changes in vision.  Respiratory: Negative for chest tightness and shortness of breath.   Cardiovascular: Negative for chest pain, palpitations and leg swelling.  Endocrine: Negative for polydipsia, polyphagia and polyuria.  Neurological: Negative for dizziness,  weakness, light-headedness and headaches.      Objective:    BP (!) 164/98 (BP Location: Left Arm, Patient Position: Sitting, Cuff Size: Large)   Pulse 82   Temp 98.1 F (36.7 C) (Oral)   Ht 5' 10.5" (1.791 m)   Wt 223 lb 3.2 oz (101.2 kg)   SpO2 97%   BMI 31.57 kg/m  Nursing note and vital signs reviewed.  Physical Exam  Constitutional: He is oriented to person, place, and time. He appears well-developed and well-nourished. No distress.  Cardiovascular: Normal rate, regular rhythm, normal heart sounds and intact distal pulses.   Pulmonary/Chest: Effort normal and breath sounds normal.    Neurological: He is alert and oriented to person, place, and time.  Skin: Skin is warm and dry.  Psychiatric: He has a normal mood and affect. His behavior is normal. Judgment and thought content normal.       Assessment & Plan:   Problem List Items Addressed This Visit      Endocrine   Type 2 diabetes mellitus (HCC) - Primary    Type 2 diabetes with slightly improved control based on home blood sugar readings. No new symptoms of end organ damage or hypo-glycemic readings. Obtain A1c. Continue current dosage of Actos, metformin, and NovoLog Mix 70/30. Diabetic foot and eye exam are up-to-date. Declines Pneumovax. Maintained on simvastatin and lisinopril for CAD risk reduction. Follow-up pending A1c results.      Relevant Orders   Hemoglobin A1c (Completed)     Genitourinary   Erectile dysfunction    Erectile dysfunction most likely multifactorial origin. Discussed possible treatments with risks and alternatives. Start sildenafil. Advised to seek emergency care if having an erection lasting than 4 hours. Follow-up pending trial of medication.          I have discontinued Mr. Colavito's LANTUS SOLOSTAR, insulin aspart protamine- aspart, and doxycycline. I am also having him start on sildenafil. Additionally, I am having him maintain his EASY COMFORT PEN NEEDLES, aspirin EC, Insulin Syringe-Needle U-100, erythromycin, acetaminophen-codeine, amLODipine, atenolol, gabapentin, insulin NPH-regular Human, lisinopril, metFORMIN, pioglitazone, and simvastatin.   Meds ordered this encounter  Medications  . sildenafil (VIAGRA) 100 MG tablet    Sig: Take 0.5-1 tablets (50-100 mg total) by mouth daily as needed for erectile dysfunction.    Dispense:  6 tablet    Refill:  0    Order Specific Question:   Supervising Provider    Answer:   Hillard DankerRAWFORD, ELIZABETH A [[id number]]     Follow-up: Return in about 3 months (around 03/28/2016), or if symptoms worsen or fail to improve.  Jeanine Luzalone, Gregory,  FNP

## 2015-12-29 NOTE — Patient Instructions (Addendum)
Thank you for choosing [facility].  SUMMARY AND INSTRUCTIONS:  Medication:  Please continue to take your medication as prescribed.   Your prescription(s) have been submitted to your pharmacy or been printed and provided for you. Please take as directed and contact our office if you believe you are having problem(s) with the medication(s) or have any questions.  Labs:  Please stop by the lab on the lower level of the building for your blood work. Your results will be released to MyChart (or called to you) after review, usually within 72 hours after test completion. If any changes need to be made, you will be notified at that same time.  1.) The lab is open from 7:30am to 5:30 pm Monday-Friday 2.) No appointment is necessary 3.) Fasting (if needed) is 6-8 hours after food and drink; black coffee and water are okay    Follow up:  If your symptoms worsen or fail to improve, please contact our office for further instruction, or in case of emergency go directly to the emergency room at the closest medical facility.     

## 2015-12-29 NOTE — Assessment & Plan Note (Signed)
Erectile dysfunction most likely multifactorial origin. Discussed possible treatments with risks and alternatives. Start sildenafil. Advised to seek emergency care if having an erection lasting than 4 hours. Follow-up pending trial of medication.

## 2015-12-29 NOTE — Assessment & Plan Note (Signed)
Type 2 diabetes with slightly improved control based on home blood sugar readings. No new symptoms of end organ damage or hypo-glycemic readings. Obtain A1c. Continue current dosage of Actos, metformin, and NovoLog Mix 70/30. Diabetic foot and eye exam are up-to-date. Declines Pneumovax. Maintained on simvastatin and lisinopril for CAD risk reduction. Follow-up pending A1c results.

## 2016-01-02 ENCOUNTER — Telehealth: Payer: Self-pay | Admitting: Family

## 2016-01-02 NOTE — Telephone Encounter (Signed)
Patient called back to get A1C results.  Gave Gregs response.  Patient was very happy with this.  Patient states he will call back to schedule 3 month fu.

## 2016-02-05 DIAGNOSIS — E119 Type 2 diabetes mellitus without complications: Secondary | ICD-10-CM | POA: Diagnosis not present

## 2016-02-05 DIAGNOSIS — Z0100 Encounter for examination of eyes and vision without abnormal findings: Secondary | ICD-10-CM | POA: Diagnosis not present

## 2016-02-28 ENCOUNTER — Ambulatory Visit (INDEPENDENT_AMBULATORY_CARE_PROVIDER_SITE_OTHER): Payer: Medicare HMO | Admitting: Family

## 2016-02-28 ENCOUNTER — Encounter: Payer: Self-pay | Admitting: Family

## 2016-02-28 VITALS — BP 128/84 | HR 65 | Temp 98.4°F | Resp 16 | Ht 70.5 in | Wt 223.0 lb

## 2016-02-28 DIAGNOSIS — I10 Essential (primary) hypertension: Secondary | ICD-10-CM

## 2016-02-28 DIAGNOSIS — E782 Mixed hyperlipidemia: Secondary | ICD-10-CM | POA: Diagnosis not present

## 2016-02-28 DIAGNOSIS — E1142 Type 2 diabetes mellitus with diabetic polyneuropathy: Secondary | ICD-10-CM

## 2016-02-28 MED ORDER — PIOGLITAZONE HCL 30 MG PO TABS: 30.0000 mg | ORAL_TABLET | Freq: Every day | ORAL | 0 refills | Status: DC

## 2016-02-28 MED ORDER — "INSULIN SYRINGE-NEEDLE U-100 31G X 15/64"" 0.5 ML MISC": 2 refills | Status: DC

## 2016-02-28 MED ORDER — SIMVASTATIN 20 MG PO TABS: 20.0000 mg | ORAL_TABLET | Freq: Every day | ORAL | 0 refills | Status: DC

## 2016-02-28 MED ORDER — ATENOLOL 50 MG PO TABS: 50.0000 mg | ORAL_TABLET | Freq: Every day | ORAL | 0 refills | Status: DC

## 2016-02-28 MED ORDER — METFORMIN HCL 1000 MG PO TABS: 1000.0000 mg | ORAL_TABLET | Freq: Two times a day (BID) | ORAL | 0 refills | Status: DC

## 2016-02-28 MED ORDER — INSULIN NPH ISOPHANE & REGULAR (70-30) 100 UNIT/ML ~~LOC~~ SUSP: SUBCUTANEOUS | 1 refills | Status: DC

## 2016-02-28 MED ORDER — LISINOPRIL 40 MG PO TABS: 40.0000 mg | ORAL_TABLET | Freq: Every day | ORAL | 0 refills | Status: DC

## 2016-02-28 MED ORDER — AMLODIPINE BESYLATE 10 MG PO TABS: 10.0000 mg | ORAL_TABLET | Freq: Every day | ORAL | 0 refills | Status: DC

## 2016-02-28 NOTE — Patient Instructions (Signed)
Thank you for choosing [facility].  SUMMARY AND INSTRUCTIONS:  Medication:  Your prescription(s) have been submitted to your pharmacy or been printed and provided for you. Please take as directed and contact our office if you believe you are having problem(s) with the medication(s) or have any questions.  Follow up:  If your symptoms worsen or fail to improve, please contact our office for further instruction, or in case of emergency go directly to the emergency room at the closest medical facility.     

## 2016-02-28 NOTE — Assessment & Plan Note (Signed)
Hypertension well-controlled and below goal of 140/90 with current regimen and no adverse side effects or hypotensive readings. Denies worst headache of life in any symptoms of end organ damage. Continue current dosage of lisinopril, atenolol, and amlodipine. Continue to monitor blood pressure at home and follow sodium diet.

## 2016-02-28 NOTE — Progress Notes (Signed)
Subjective:    Patient ID: John Dalton, male    DOB: 1960/03/19, 56 y.o.   MRN: [id number]  Chief Complaint  Patient presents with  . Medication Refill    needs refill on all meds except pen needles and viagra    HPI:  John Dalton is a 56 y.o. male who  has a past medical history of Allergy; Diabetes mellitus (HCC); Diverticulitis; Environmental and seasonal allergies; History of positive PPD; HTN (hypertension); and Hypercholesteremia. and presents today For a follow-up office visit.  1.) Type 2 diabetes -  currently maintained on by mouth glitazone, metformin, and Novolin 70/30. Reports taking medications as prescribed and denies adverse side effects or hypoglycemic readings. Has noted elevated blood sugar readings throughout the day when he does not take his 70/30. Blood sugars at home have been averaging averaging about 110 and higher blood sugars in the evening. Denies any changes in vision with no new symptoms of end organ damage. Cotninues to take gabapentin for neuropathic pains.  Continues to work on Journalist, newspaper. Declines Pneumovax. Foot exam and urine microalbumin are up-to-date.  Lab Results  Component Value Date   HGBA1C 8.8 (H) 12/29/2015    2.) Hypertension - currently maintained on lisinopril, atenolol and amlodipine. Reports taken medications as prescribed and denies adverse side effects. Denies worst headache of life with no new symptoms of end organ damage. Working on following a low-sodium diet. Blood pressures at home  BP Readings from Last 3 Encounters:  02/28/16 128/84  12/29/15 (!) 164/98  12/11/15 140/80    3.) Hyperlipidemia - currently maintained on simvastatin. Reports taking medication as prescribed and denies adverse side effects or myalgias. Denies chest pain, shortness of breath, or paroxysmal nocturnal dyspnea.  Lab Results  Component Value Date   CHOL 153 08/16/2015   HDL 34.00 (L) 08/16/2015   LDLCALC 94 08/16/2015   TRIG 122.0 08/16/2015   CHOLHDL 4 08/16/2015    No Known Allergies    Outpatient Medications Prior to Visit  Medication Sig Dispense Refill  . aspirin EC 81 MG tablet Take 81 mg by mouth daily.    Marland Kitchen gabapentin (NEURONTIN) 600 MG tablet Take 1 tablet (600 mg total) by mouth 2 (two) times daily. 180 tablet 0  . sildenafil (VIAGRA) 100 MG tablet Take 0.5-1 tablets (50-100 mg total) by mouth daily as needed for erectile dysfunction. 6 tablet 0  . amLODipine (NORVASC) 10 MG tablet Take 1 tablet (10 mg total) by mouth daily. 90 tablet 0  . atenolol (TENORMIN) 50 MG tablet Take 1 tablet (50 mg total) by mouth daily. 90 tablet 0  . EASY COMFORT PEN NEEDLES 31G X 5 MM MISC     . erythromycin ophthalmic ointment Place 1 application into the left eye 4 (four) times daily. 3.5 g 0  . insulin NPH-regular Human (NOVOLIN 70/30 RELION) (70-30) 100 UNIT/ML injection Inject 35 units in the morning and 30 units at night subcutaneously. 20 mL 1  . Insulin Syringe-Needle U-100 (BD INSULIN SYRINGE ULTRAFINE) 31G X 15/64" 0.5 ML MISC Use 1 needle per injection to inject insulin 1-4 times daily as instructed. 100 each 2  . lisinopril (PRINIVIL,ZESTRIL) 40 MG tablet Take 1 tablet (40 mg total) by mouth daily. 90 tablet 0  . metFORMIN (GLUCOPHAGE) 1000 MG tablet Take 1 tablet (1,000 mg total) by mouth 2 (two) times daily with a meal. 180 tablet 0  . pioglitazone (ACTOS) 30 MG tablet Take 1 tablet (30 mg total) by mouth daily. 90 tablet  0  . simvastatin (ZOCOR) 20 MG tablet Take 1 tablet (20 mg total) by mouth daily. 90 tablet 0  . acetaminophen-codeine (TYLENOL #3) 300-30 MG tablet Take 1-2 tablets by mouth every 6 (six) hours as needed for moderate pain. 30 tablet 0   No facility-administered medications prior to visit.       Past Surgical History:  Procedure Laterality Date  . Colon resection        Past Medical History:  Diagnosis Date  . Allergy   . Diabetes mellitus (HCC)   . Diverticulitis   . Environmental and  seasonal allergies   . 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 HTN (hypertension)   . Hypercholesteremia       Review of Systems  Constitutional: Negative for chills and fever.  Eyes:       Negative for changes in vision  Respiratory: Negative for cough, chest tightness, shortness of breath and wheezing.   Cardiovascular: Negative for chest pain, palpitations and leg swelling.  Endocrine: Negative for polydipsia, polyphagia and polyuria.  Neurological: Negative for dizziness, weakness, light-headedness and headaches.      Objective:    BP 128/84 (BP Location: Left Arm, Patient Position: Sitting, Cuff Size: Large)   Pulse 65   Temp 98.4 F (36.9 C) (Oral)   Resp 16   Ht 5' 10.5" (1.791 m)   Wt 223 lb (101.2 kg)   SpO2 97%   BMI 31.54 kg/m  Nursing note and vital signs reviewed.  Physical Exam  Constitutional: He is oriented to person, place, and time. He appears well-developed and well-nourished. No distress.  Cardiovascular: Normal rate, regular rhythm, normal heart sounds and intact distal pulses.   Pulmonary/Chest: Effort normal and breath sounds normal.  Neurological: He is alert and oriented to person, place, and time.  Skin: Skin is warm and dry.  Psychiatric: He has a normal mood and affect. His behavior is normal. Judgment and thought content normal.       Assessment & Plan:   Problem List Items Addressed This Visit      Cardiovascular and Mediastinum   Hypertension, essential    Hypertension well-controlled and below goal of 140/90 with current regimen and no adverse side effects or hypotensive readings. Denies worst headache of life in any symptoms of end organ damage. Continue current dosage of lisinopril, atenolol, and amlodipine. Continue to monitor blood pressure at home and follow sodium diet.      Relevant Medications   atenolol (TENORMIN) 50 MG tablet   amLODipine (NORVASC) 10 MG tablet   lisinopril  (PRINIVIL,ZESTRIL) 40 MG tablet   simvastatin (ZOCOR) 20 MG tablet     Endocrine   Type 2 diabetes mellitus (HCC) - Primary    Type 2 diabetes with improved control with fasting blood sugars around 110 at home. Denies hypoglycemic readings. Cleared up patient misunderstanding of proper medication administration. Continue current dosage of Novolin 70/30, metformin, and Actos. Diabetic screenings are up-to-date. Declines Pneumovax. Continue to monitor blood sugars at home follow-up in 2 months for A1c check.      Relevant Medications   insulin NPH-regular Human (NOVOLIN 70/30 RELION) (70-30) 100 UNIT/ML injection   lisinopril (PRINIVIL,ZESTRIL) 40 MG tablet   metFORMIN (GLUCOPHAGE) 1000 MG tablet   pioglitazone (ACTOS) 30 MG tablet   simvastatin (ZOCOR) 20 MG tablet     Other   Hyperlipidemia    Appears stable with current medication regimen and no adverse  side effects. Continue current dosage of simvastatin.      Relevant Medications   atenolol (TENORMIN) 50 MG tablet   amLODipine (NORVASC) 10 MG tablet   lisinopril (PRINIVIL,ZESTRIL) 40 MG tablet   simvastatin (ZOCOR) 20 MG tablet       I have discontinued Mr. Nee's EASY COMFORT PEN NEEDLES, erythromycin, and acetaminophen-codeine. I have also changed his insulin NPH-regular Human. Additionally, I am having him maintain his aspirin EC, gabapentin, sildenafil, atenolol, amLODipine, Insulin Syringe-Needle U-100, lisinopril, metFORMIN, pioglitazone, and simvastatin.   Meds ordered this encounter  Medications  . atenolol (TENORMIN) 50 MG tablet    Sig: Take 1 tablet (50 mg total) by mouth daily.    Dispense:  90 tablet    Refill:  0    Order Specific Question:   Supervising Provider    Answer:   Hillard DankerRAWFORD, ELIZABETH A [[id number]]  . amLODipine (NORVASC) 10 MG tablet    Sig: Take 1 tablet (10 mg total) by mouth daily.    Dispense:  90 tablet    Refill:  0    Order Specific Question:   Supervising Provider    Answer:   Hillard DankerRAWFORD,  ELIZABETH A [[id number]]  . insulin NPH-regular Human (NOVOLIN 70/30 RELION) (70-30) 100 UNIT/ML injection    Sig: Inject 45 units in the morning and 45 units at night subcutaneously.    Dispense:  20 mL    Refill:  1    Dx: E11.9    Order Specific Question:   Supervising Provider    Answer:   Hillard DankerRAWFORD, ELIZABETH A [[id number]]  . Insulin Syringe-Needle U-100 (BD INSULIN SYRINGE ULTRAFINE) 31G X 15/64" 0.5 ML MISC    Sig: Use 1 needle per injection to inject insulin 1-4 times daily as instructed.    Dispense:  100 each    Refill:  2    Dx: E11.9    Order Specific Question:   Supervising Provider    Answer:   Hillard DankerRAWFORD, ELIZABETH A [[id number]]  . lisinopril (PRINIVIL,ZESTRIL) 40 MG tablet    Sig: Take 1 tablet (40 mg total) by mouth daily.    Dispense:  90 tablet    Refill:  0    Order Specific Question:   Supervising Provider    Answer:   Hillard DankerRAWFORD, ELIZABETH A [[id number]]  . metFORMIN (GLUCOPHAGE) 1000 MG tablet    Sig: Take 1 tablet (1,000 mg total) by mouth 2 (two) times daily with a meal.    Dispense:  180 tablet    Refill:  0    Order Specific Question:   Supervising Provider    Answer:   Hillard DankerRAWFORD, ELIZABETH A [[id number]]  . pioglitazone (ACTOS) 30 MG tablet    Sig: Take 1 tablet (30 mg total) by mouth daily.    Dispense:  90 tablet    Refill:  0    Order Specific Question:   Supervising Provider    Answer:   Hillard DankerRAWFORD, ELIZABETH A [[id number]]  . simvastatin (ZOCOR) 20 MG tablet    Sig: Take 1 tablet (20 mg total) by mouth daily.    Dispense:  90 tablet    Refill:  0    Order Specific Question:   Supervising Provider    Answer:   Hillard DankerRAWFORD, ELIZABETH A [[id number]]     Follow-up: Return in about 2 months (around 04/27/2016), or if symptoms worsen or fail to improve.  Jeanine Luzalone, Jamisha Hoeschen, FNP

## 2016-02-28 NOTE — Assessment & Plan Note (Signed)
Type 2 diabetes with improved control with fasting blood sugars around 110 at home. Denies hypoglycemic readings. Cleared up patient misunderstanding of proper medication administration. Continue current dosage of Novolin 70/30, metformin, and Actos. Diabetic screenings are up-to-date. Declines Pneumovax. Continue to monitor blood sugars at home follow-up in 2 months for A1c check.

## 2016-02-28 NOTE — Assessment & Plan Note (Signed)
Appears stable with current medication regimen and no adverse side effects. Continue current dosage of simvastatin.

## 2016-04-15 ENCOUNTER — Telehealth: Payer: Self-pay | Admitting: Family

## 2016-04-15 MED ORDER — INSULIN NPH ISOPHANE & REGULAR (70-30) 100 UNIT/ML ~~LOC~~ SUSP: SUBCUTANEOUS | 1 refills | Status: DC

## 2016-04-15 NOTE — Telephone Encounter (Signed)
Pt called back and he still wants to come in and talk about which kind of insulin he needs.

## 2016-04-15 NOTE — Telephone Encounter (Signed)
Pt called and scheduled an appt because he is out of insulin. Will he need to come in for this or can it be prescribed.  The appt is scheduled for tomorrow.  Please advise

## 2016-04-15 NOTE — Telephone Encounter (Signed)
Noted  

## 2016-04-15 NOTE — Telephone Encounter (Signed)
LVM letting pt know that he does not need to come in for OV for the refill. Refill has been sent to pharmacy. Called to advise that he cancel appointment if he is only coming in for a refill but can keep the appointment if needed for any other concerns.

## 2016-04-16 ENCOUNTER — Encounter: Payer: Self-pay | Admitting: Family

## 2016-04-16 ENCOUNTER — Ambulatory Visit (INDEPENDENT_AMBULATORY_CARE_PROVIDER_SITE_OTHER): Payer: Medicare HMO | Admitting: Family

## 2016-04-16 VITALS — BP 152/92 | HR 103 | Temp 98.3°F | Resp 16 | Ht 70.5 in | Wt 221.0 lb

## 2016-04-16 DIAGNOSIS — E1142 Type 2 diabetes mellitus with diabetic polyneuropathy: Secondary | ICD-10-CM | POA: Diagnosis not present

## 2016-04-16 MED ORDER — ATENOLOL 50 MG PO TABS
50.0000 mg | ORAL_TABLET | Freq: Every day | ORAL | 0 refills | Status: DC
Start: 2016-04-16 — End: 2016-04-16

## 2016-04-16 MED ORDER — AMLODIPINE BESYLATE 10 MG PO TABS
10.0000 mg | ORAL_TABLET | Freq: Every day | ORAL | 0 refills | Status: DC
Start: 2016-04-16 — End: 2016-11-27

## 2016-04-16 MED ORDER — INSULIN NPH ISOPHANE & REGULAR (70-30) 100 UNIT/ML ~~LOC~~ SUSP: 45.0000 [IU] | Freq: Two times a day (BID) | SUBCUTANEOUS | 5 refills | Status: DC

## 2016-04-16 MED ORDER — ATENOLOL 50 MG PO TABS: 50.0000 mg | ORAL_TABLET | Freq: Every day | ORAL | 0 refills | Status: DC

## 2016-04-16 NOTE — Progress Notes (Signed)
Subjective:    Patient ID: John Dalton, male    DOB: Mar 25, 1960, 56 y.o.   MRN: [id number]  Chief Complaint  Patient presents with  . Medication Problem    HPI:  Christoph Copelan is a 56 y.o. male who  has a past medical history of Allergy; Diabetes mellitus (HCC); Diverticulitis; Environmental and seasonal allergies; History of positive PPD; HTN (hypertension); and Hypercholesteremia. and presents today for a follow up office visit.  Type 2 diabetes-  Currently maintained on Actos, metformin, and Novolin 70/30. Reports taking medication as prescribed and denies adverse side effects. Went to pick up new prescription for insulin from pharmacy and noted that his medication was changed and more expensive. Does endorse some continued neuropathy.   Lab Results  Component Value Date   HGBA1C 8.8 (H) 12/29/2015    No Known Allergies    Outpatient Medications Prior to Visit  Medication Sig Dispense Refill  . aspirin EC 81 MG tablet Take 81 mg by mouth daily.    Marland Kitchen gabapentin (NEURONTIN) 600 MG tablet Take 1 tablet (600 mg total) by mouth 2 (two) times daily. 180 tablet 0  . Insulin Syringe-Needle U-100 (BD INSULIN SYRINGE ULTRAFINE) 31G X 15/64" 0.5 ML MISC Use 1 needle per injection to inject insulin 1-4 times daily as instructed. 100 each 2  . lisinopril (PRINIVIL,ZESTRIL) 40 MG tablet Take 1 tablet (40 mg total) by mouth daily. 90 tablet 0  . metFORMIN (GLUCOPHAGE) 1000 MG tablet Take 1 tablet (1,000 mg total) by mouth 2 (two) times daily with a meal. 180 tablet 0  . pioglitazone (ACTOS) 30 MG tablet Take 1 tablet (30 mg total) by mouth daily. 90 tablet 0  . sildenafil (VIAGRA) 100 MG tablet Take 0.5-1 tablets (50-100 mg total) by mouth daily as needed for erectile dysfunction. 6 tablet 0  . simvastatin (ZOCOR) 20 MG tablet Take 1 tablet (20 mg total) by mouth daily. 90 tablet 0  . amLODipine (NORVASC) 10 MG tablet Take 1 tablet (10 mg total) by mouth daily. 90 tablet 0  . atenolol  (TENORMIN) 50 MG tablet Take 1 tablet (50 mg total) by mouth daily. 90 tablet 0  . insulin NPH-regular Human (NOVOLIN 70/30 RELION) (70-30) 100 UNIT/ML injection Inject 45 units in the morning and 45 units at night subcutaneously. 20 mL 1   No facility-administered medications prior to visit.     Review of Systems  Eyes:       Negative for changes in vision.  Respiratory: Negative for chest tightness and shortness of breath.   Cardiovascular: Negative for chest pain, palpitations and leg swelling.  Endocrine: Negative for polydipsia, polyphagia and polyuria.  Neurological: Negative for dizziness, weakness, light-headedness and headaches.      Objective:    BP (!) 152/92 (BP Location: Left Arm, Patient Position: Sitting, Cuff Size: Large)   Pulse (!) 103   Temp 98.3 F (36.8 C) (Oral)   Resp 16   Ht 5' 10.5" (1.791 m)   Wt 221 lb (100.2 kg)   SpO2 97%   BMI 31.26 kg/m  Nursing note and vital signs reviewed.  Physical Exam  Constitutional: He is oriented to person, place, and time. He appears well-developed and well-nourished. No distress.  Cardiovascular: Normal rate, regular rhythm, normal heart sounds and intact distal pulses.   Pulmonary/Chest: Effort normal and breath sounds normal.  Neurological: He is alert and oriented to person, place, and time.  Skin: Skin is warm and dry.  Psychiatric: He has a  normal mood and affect. His behavior is normal. Judgment and thought content normal.       Assessment & Plan:   Problem List Items Addressed This Visit      Endocrine   Type 2 diabetes mellitus (HCC) - Primary    Written prescription for 70/30 given with isophane. Due for A1c at next office visit. Recommend starting Benfotiamine and alpha-lipoic acid to help with neuropathy. Continue current dosage of Relion 70/30, Actos, and metformin. Declines pneumovax. Foot exam is up to date. Continue to monitor.       Relevant Medications   insulin NPH-regular Human (NOVOLIN  70/30) (70-30) 100 UNIT/ML injection       I have discontinued Mr. Diloreto's insulin NPH-regular Human and insulin NPH-regular Human. I am also having him start on insulin NPH-regular Human. Additionally, I am having him maintain his aspirin EC, gabapentin, sildenafil, Insulin Syringe-Needle U-100, lisinopril, metFORMIN, pioglitazone, simvastatin, amLODipine, and atenolol.   Meds ordered this encounter  Medications  . DISCONTD: atenolol (TENORMIN) 50 MG tablet    Sig: Take 1 tablet (50 mg total) by mouth daily.    Dispense:  90 tablet    Refill:  0  . amLODipine (NORVASC) 10 MG tablet    Sig: Take 1 tablet (10 mg total) by mouth daily.    Dispense:  90 tablet    Refill:  0    Order Specific Question:   Supervising Provider    Answer:   Hillard DankerRAWFORD, ELIZABETH A [[id number]]  . atenolol (TENORMIN) 50 MG tablet    Sig: Take 1 tablet (50 mg total) by mouth daily.    Dispense:  90 tablet    Refill:  0    Order Specific Question:   Supervising Provider    Answer:   Hillard DankerRAWFORD, ELIZABETH A [[id number]]  . DISCONTD: insulin NPH-regular Human (NOVOLIN 70/30) (70-30) 100 UNIT/ML injection    Sig: Inject 45 Units into the skin 2 (two) times daily with a meal.    Dispense:  10 mL    Refill:  5    Order Specific Question:   Supervising Provider    Answer:   Hillard DankerRAWFORD, ELIZABETH A [[id number]]  . insulin NPH-regular Human (NOVOLIN 70/30) (70-30) 100 UNIT/ML injection    Sig: Inject 45 Units into the skin 2 (two) times daily with a meal.    Dispense:  10 mL    Refill:  5    NPH 70/30 Isophane please    Order Specific Question:   Supervising Provider    Answer:   Hillard DankerRAWFORD, ELIZABETH A [[id number]]     Follow-up: Return if symptoms worsen or fail to improve.  Jeanine Luzalone, Dewitte Vannice, FNP

## 2016-04-16 NOTE — Patient Instructions (Signed)
Thank you for choosing [facility].  SUMMARY AND INSTRUCTIONS:  Benfotiamine 300 mg daily Alpha-lipoic acid 600 mg daily.  Medication:  Please continue to take your medications as prescribed.   Your prescription(s) have been submitted to your pharmacy or been printed and provided for you. Please take as directed and contact our office if you believe you are having problem(s) with the medication(s) or have any questions.  Follow up:  If your symptoms worsen or fail to improve, please contact our office for further instruction, or in case of emergency go directly to the emergency room at the closest medical facility.

## 2016-04-16 NOTE — Assessment & Plan Note (Signed)
Written prescription for 70/30 given with isophane. Due for A1c at next office visit. Recommend starting Benfotiamine and alpha-lipoic acid to help with neuropathy. Continue current dosage of Relion 70/30, Actos, and metformin. Declines pneumovax. Foot exam is up to date. Continue to monitor.

## 2016-04-24 ENCOUNTER — Emergency Department (HOSPITAL_COMMUNITY)
Admission: EM | Admit: 2016-04-24 | Discharge: 2016-04-24 | Disposition: A | Discharge status: Home / Self Care | Payer: Medicare HMO | Attending: Emergency Medicine | Admitting: Emergency Medicine

## 2016-04-24 ENCOUNTER — Encounter (HOSPITAL_COMMUNITY): Payer: Self-pay

## 2016-04-24 DIAGNOSIS — I10 Essential (primary) hypertension: Secondary | ICD-10-CM | POA: Insufficient documentation

## 2016-04-24 DIAGNOSIS — Z79899 Other long term (current) drug therapy: Secondary | ICD-10-CM | POA: Insufficient documentation

## 2016-04-24 DIAGNOSIS — R200 Anesthesia of skin: Secondary | ICD-10-CM | POA: Diagnosis not present

## 2016-04-24 DIAGNOSIS — R739 Hyperglycemia, unspecified: Secondary | ICD-10-CM

## 2016-04-24 DIAGNOSIS — F17210 Nicotine dependence, cigarettes, uncomplicated: Secondary | ICD-10-CM | POA: Diagnosis not present

## 2016-04-24 DIAGNOSIS — R7309 Other abnormal glucose: Secondary | ICD-10-CM | POA: Diagnosis not present

## 2016-04-24 DIAGNOSIS — Z794 Long term (current) use of insulin: Secondary | ICD-10-CM | POA: Insufficient documentation

## 2016-04-24 DIAGNOSIS — Z7982 Long term (current) use of aspirin: Secondary | ICD-10-CM | POA: Insufficient documentation

## 2016-04-24 DIAGNOSIS — E1165 Type 2 diabetes mellitus with hyperglycemia: Secondary | ICD-10-CM | POA: Diagnosis not present

## 2016-04-24 LAB — URINALYSIS, ROUTINE W REFLEX MICROSCOPIC
BILIRUBIN URINE: NEGATIVE
HGB URINE DIPSTICK: NEGATIVE
Ketones, ur: NEGATIVE mg/dL
Leukocytes, UA: NEGATIVE
NITRITE: NEGATIVE
PROTEIN: NEGATIVE mg/dL
Specific Gravity, Urine: 1.015 (ref 1.005–1.030)
Squamous Epithelial / LPF: NONE SEEN
pH: 6 (ref 5.0–8.0)

## 2016-04-24 LAB — BASIC METABOLIC PANEL
ANION GAP: 7 (ref 5–15)
BUN: 21 mg/dL — ABNORMAL HIGH (ref 6–20)
CALCIUM: 9.3 mg/dL (ref 8.9–10.3)
CO2: 25 mmol/L (ref 22–32)
CREATININE: 1.32 mg/dL — AB (ref 0.61–1.24)
Chloride: 104 mmol/L (ref 101–111)
GFR, EST NON AFRICAN AMERICAN: 59 mL/min — AB (ref >60)
Glucose, Bld: 460 mg/dL — ABNORMAL HIGH (ref 65–99)
Potassium: 4.2 mmol/L (ref 3.5–5.1)
SODIUM: 136 mmol/L (ref 135–145)

## 2016-04-24 LAB — CBG MONITORING, ED
Glucose-Capillary: 421 mg/dL — ABNORMAL HIGH (ref 65–99)
Glucose-Capillary: 355 mg/dL — ABNORMAL HIGH (ref 65–99)

## 2016-04-24 LAB — CBC
HCT: 38.5 % — ABNORMAL LOW (ref 39.0–52.0)
HEMOGLOBIN: 13.5 g/dL (ref 13.0–17.0)
MCH: 27.1 pg (ref 26.0–34.0)
MCHC: 35.1 g/dL (ref 30.0–36.0)
MCV: 77.2 fL — ABNORMAL LOW (ref 78.0–100.0)
PLATELETS: 209 10*3/uL (ref 150–400)
RBC: 4.99 MIL/uL (ref 4.22–5.81)
RDW: 14.1 % (ref 11.5–15.5)
WBC: 6.4 10*3/uL (ref 4.0–10.5)

## 2016-04-24 MED ORDER — INSULIN ASPART 100 UNIT/ML ~~LOC~~ SOLN
10.0000 [IU] | Freq: Once | SUBCUTANEOUS | Status: AC
  Administered 2016-04-24: 10 [IU] via SUBCUTANEOUS
  Filled 2016-04-24: qty 1

## 2016-04-24 NOTE — ED Provider Notes (Signed)
[facility]-EMERGENCY DEPT Provider Note   CSN: [id number] Arrival date & time: 04/24/16  0434     History   Chief Complaint Chief Complaint  Patient presents with  . Hyperglycemia    HPI John Dalton is a 56 y.o. male here with hyperglycemia. Unable to afford his insulin for the past 3 weeks. He is managed with metformin, insulin and pioglitazone. He has been taking metformin. He called 911 because he felt his sugars were high due to polyuria, polydipsia and "slowed" mentation. He denies cp/ sob, abdominal pain, nausea or vomiting.  HPI  Past Medical History:  Diagnosis Date  . Allergy   . Diabetes mellitus (HCC)   . Diverticulitis   . Environmental and seasonal allergies   . 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 HTN (hypertension)   . Hypercholesteremia     Patient Active Problem List   Diagnosis Date Noted  . Erectile dysfunction 12/29/2015  . Abscess of back 12/11/2015  . Heart palpitations 09/25/2015  . Stye external 09/25/2015  . Type 2 diabetes mellitus (HCC) 08/16/2015  . Encounter for routine adult medical exam with abnormal findings 08/16/2015  . Medicare annual wellness visit, subsequent 08/16/2015  . Hypertension, essential 08/16/2015  . Hyperlipidemia 08/16/2015  . Obesity 08/16/2015  . Impacted cerumen of both ears 08/16/2015    Past Surgical History:  Procedure Laterality Date  . Colon resection         Home Medications    Prior to Admission medications   Medication Sig Start Date End Date Taking? Authorizing Provider  amLODipine (NORVASC) 10 MG tablet Take 1 tablet (10 mg total) by mouth daily. 04/16/16   Veryl Speak, FNP  aspirin EC 81 MG tablet Take 81 mg by mouth daily.    Historical Provider, MD  atenolol (TENORMIN) 50 MG tablet Take 1 tablet (50 mg total) by mouth daily. 04/16/16   Veryl Speak, FNP  gabapentin (NEURONTIN) 600 MG tablet Take 1 tablet (600 mg total) by mouth 2 (two) times daily.  12/11/15   Veryl Speak, FNP  insulin NPH-regular Human (NOVOLIN 70/30) (70-30) 100 UNIT/ML injection Inject 45 Units into the skin 2 (two) times daily with a meal. 04/16/16   Veryl Speak, FNP  Insulin Syringe-Needle U-100 (BD INSULIN SYRINGE ULTRAFINE) 31G X 15/64" 0.5 ML MISC Use 1 needle per injection to inject insulin 1-4 times daily as instructed. 02/28/16   Veryl Speak, FNP  lisinopril (PRINIVIL,ZESTRIL) 40 MG tablet Take 1 tablet (40 mg total) by mouth daily. 02/28/16   Veryl Speak, FNP  metFORMIN (GLUCOPHAGE) 1000 MG tablet Take 1 tablet (1,000 mg total) by mouth 2 (two) times daily with a meal. 02/28/16   Veryl Speak, FNP  pioglitazone (ACTOS) 30 MG tablet Take 1 tablet (30 mg total) by mouth daily. 02/28/16   Veryl Speak, FNP  sildenafil (VIAGRA) 100 MG tablet Take 0.5-1 tablets (50-100 mg total) by mouth daily as needed for erectile dysfunction. 12/29/15   Veryl Speak, FNP  simvastatin (ZOCOR) 20 MG tablet Take 1 tablet (20 mg total) by mouth daily. 02/28/16   Veryl Speak, FNP    Family History Family History  Problem Relation Age of Onset  . Diabetes Father   . Hypertension Mother   . Hypertension Sister   . Diabetes Paternal Grandmother   . Hypertension Paternal Grandmother   . Diabetes Paternal Grandfather   . Hypertension Paternal Grandfather   .  Hypertension Maternal Grandmother   . Hypertension Maternal Grandfather     Social History Social History  Substance Use Topics  . Smoking status: Current Some Day Smoker    Packs/day: 0.00    Years: 20.00    Types: Cigarettes  . Smokeless tobacco: Never Used  . Alcohol use No     Allergies   Patient has no known allergies.   Review of Systems Review of Systems Ten systems reviewed and are negative for acute change, except as noted in the HPI.    Physical Exam Updated Vital Signs There were no vitals taken for this visit.  Physical Exam  Constitutional: He appears well-developed  and well-nourished. No distress.  HENT:  Head: Normocephalic and atraumatic.  Eyes: Conjunctivae are normal. No scleral icterus.  Neck: Normal range of motion. Neck supple.  Cardiovascular: Normal rate, regular rhythm and normal heart sounds.   Pulmonary/Chest: Effort normal and breath sounds normal. No respiratory distress.  Abdominal: Soft. There is no tenderness.  Musculoskeletal: He exhibits no edema.  Neurological: He is alert.  Skin: Skin is warm and dry. He is not diaphoretic.  Psychiatric: His behavior is normal.  Nursing note and vitals reviewed.    ED Treatments / Results  Labs (all labs ordered are listed, but only abnormal results are displayed) Labs Reviewed  BASIC METABOLIC PANEL  CBC  URINALYSIS, ROUTINE W REFLEX MICROSCOPIC  CBG MONITORING, ED    EKG  EKG Interpretation None       Radiology No results found.  Procedures Procedures (including critical care time)  Medications Ordered in ED Medications - No data to display   Initial Impression / Assessment and Plan / ED Course  I have reviewed the triage vital signs and the nursing notes.  Pertinent labs & imaging results that were available during my care of the patient were reviewed by me and considered in my medical decision making (see chart for details).  Clinical Course as of Apr 26 714  Wed Apr 24, 2016  0555 Patient with hyperglycemia. No signs of DKA.  Receiving fluids and sub q insulin.  [AH]    Clinical Course User Index [AH] Arthor CaptainAbigail Akasha Melena, PA-C    Patient with hyperglycemia, no anion gap. No signs of infection. He has an elevated serum creatinine. However, the baseline. Patient appears safe for discharge. He is advised to fill his insulin and use it at home. Discussed return precautions. The patient. He is safe for discharge at this time Final Clinical Impressions(s) / ED Diagnoses   Final diagnoses:  Hyperglycemia    New Prescriptions New Prescriptions   No medications on  file     Arthor Captainbigail Smith Potenza, PA-C 04/25/16 [id number]    Geoffery Lyonsouglas Delo, MD 04/25/16 [id number]

## 2016-04-24 NOTE — ED Notes (Signed)
Pt is having trouble paying for his insulin and so his blood sugar is out of control Pt complains of neuropathy type sx, increased thirst, and confusion

## 2016-04-24 NOTE — Discharge Instructions (Signed)
Please fill and rake your insulin medications.  You have high blood sugars but do not have any emergency side affects such as kidney injury , metabolic acidosis, or infections.It is imperative that you fill and take you insulin medications as prescribed.   Get help right away if: You have difficulty breathing. You have a change in how you think, feel, or act (mental status). You have nausea or vomiting that does not go away.

## 2016-04-24 NOTE — ED Notes (Signed)
Bed: [id number] Expected date:  Expected time:  Means of arrival:  Comments: 56 yo m/ hyperglycemia

## 2016-05-02 ENCOUNTER — Emergency Department (HOSPITAL_COMMUNITY)
Admission: EM | Admit: 2016-05-02 | Discharge: 2016-05-02 | Disposition: A | Discharge status: Home / Self Care | Payer: Medicare HMO | Attending: Emergency Medicine | Admitting: Emergency Medicine

## 2016-05-02 ENCOUNTER — Encounter (HOSPITAL_COMMUNITY): Payer: Self-pay | Admitting: Emergency Medicine

## 2016-05-02 ENCOUNTER — Emergency Department (HOSPITAL_COMMUNITY): Payer: Medicare HMO

## 2016-05-02 ENCOUNTER — Emergency Department (HOSPITAL_BASED_OUTPATIENT_CLINIC_OR_DEPARTMENT_OTHER): Admit: 2016-05-02 | Discharge: 2016-05-02 | Disposition: A | Discharge status: Home / Self Care | Payer: Medicare HMO

## 2016-05-02 DIAGNOSIS — M79609 Pain in unspecified limb: Secondary | ICD-10-CM | POA: Diagnosis not present

## 2016-05-02 DIAGNOSIS — M25572 Pain in left ankle and joints of left foot: Secondary | ICD-10-CM

## 2016-05-02 DIAGNOSIS — Z7982 Long term (current) use of aspirin: Secondary | ICD-10-CM | POA: Insufficient documentation

## 2016-05-02 DIAGNOSIS — E1165 Type 2 diabetes mellitus with hyperglycemia: Secondary | ICD-10-CM | POA: Diagnosis not present

## 2016-05-02 DIAGNOSIS — F17210 Nicotine dependence, cigarettes, uncomplicated: Secondary | ICD-10-CM | POA: Diagnosis not present

## 2016-05-02 DIAGNOSIS — Z794 Long term (current) use of insulin: Secondary | ICD-10-CM | POA: Insufficient documentation

## 2016-05-02 DIAGNOSIS — Z79899 Other long term (current) drug therapy: Secondary | ICD-10-CM | POA: Insufficient documentation

## 2016-05-02 DIAGNOSIS — M7989 Other specified soft tissue disorders: Secondary | ICD-10-CM | POA: Diagnosis not present

## 2016-05-02 DIAGNOSIS — I10 Essential (primary) hypertension: Secondary | ICD-10-CM | POA: Diagnosis not present

## 2016-05-02 DIAGNOSIS — R739 Hyperglycemia, unspecified: Secondary | ICD-10-CM | POA: Diagnosis not present

## 2016-05-02 DIAGNOSIS — R7889 Finding of other specified substances, not normally found in blood: Secondary | ICD-10-CM | POA: Diagnosis not present

## 2016-05-02 DIAGNOSIS — M79672 Pain in left foot: Secondary | ICD-10-CM | POA: Diagnosis not present

## 2016-05-02 LAB — URINALYSIS, ROUTINE W REFLEX MICROSCOPIC
BACTERIA UA: NONE SEEN
Bilirubin Urine: NEGATIVE
Glucose, UA: >=500 mg/dL — AB
HGB URINE DIPSTICK: NEGATIVE
Ketones, ur: NEGATIVE mg/dL
Leukocytes, UA: NEGATIVE
Nitrite: NEGATIVE
PROTEIN: NEGATIVE mg/dL
SQUAMOUS EPITHELIAL / LPF: NONE SEEN
Specific Gravity, Urine: 1.021 (ref 1.005–1.030)
pH: 6 (ref 5.0–8.0)

## 2016-05-02 LAB — CBC
HEMATOCRIT: 36.9 % — AB (ref 39.0–52.0)
Hemoglobin: 12.9 g/dL — ABNORMAL LOW (ref 13.0–17.0)
MCH: 26.9 pg (ref 26.0–34.0)
MCHC: 35 g/dL (ref 30.0–36.0)
MCV: 76.9 fL — ABNORMAL LOW (ref 78.0–100.0)
Platelets: 191 10*3/uL (ref 150–400)
RBC: 4.8 MIL/uL (ref 4.22–5.81)
RDW: 14.3 % (ref 11.5–15.5)
WBC: 8.2 10*3/uL (ref 4.0–10.5)

## 2016-05-02 LAB — CBG MONITORING, ED: GLUCOSE-CAPILLARY: 574 mg/dL — AB (ref 65–99)

## 2016-05-02 LAB — BASIC METABOLIC PANEL
ANION GAP: 9 (ref 5–15)
BUN: 22 mg/dL — AB (ref 6–20)
CO2: 22 mmol/L (ref 22–32)
Calcium: 8.8 mg/dL — ABNORMAL LOW (ref 8.9–10.3)
Chloride: 101 mmol/L (ref 101–111)
Creatinine, Ser: 1.66 mg/dL — ABNORMAL HIGH (ref 0.61–1.24)
GFR calc Af Amer: 52 mL/min — ABNORMAL LOW (ref >60)
GFR, EST NON AFRICAN AMERICAN: 45 mL/min — AB (ref >60)
Glucose, Bld: 590 mg/dL (ref 65–99)
POTASSIUM: 4.5 mmol/L (ref 3.5–5.1)
Sodium: 132 mmol/L — ABNORMAL LOW (ref 135–145)

## 2016-05-02 MED ORDER — TRAMADOL HCL 50 MG PO TABS
50.0000 mg | ORAL_TABLET | Freq: Once | ORAL | Status: AC
  Administered 2016-05-02: 50 mg via ORAL
  Filled 2016-05-02: qty 1

## 2016-05-02 MED ORDER — TRAMADOL HCL 50 MG PO TABS: 50.0000 mg | ORAL_TABLET | Freq: Four times a day (QID) | ORAL | 0 refills | Status: DC | PRN

## 2016-05-02 MED ORDER — SODIUM CHLORIDE 0.9 % IV BOLUS (SEPSIS)
1000.0000 mL | Freq: Once | INTRAVENOUS | Status: AC
Start: 2016-05-02 — End: 2016-05-02
  Administered 2016-05-02: 1000 mL via INTRAVENOUS

## 2016-05-02 NOTE — ED Notes (Signed)
Patient transported to X-ray 

## 2016-05-02 NOTE — ED Notes (Signed)
Vascular US at bedside.

## 2016-05-02 NOTE — Progress Notes (Signed)
**  Preliminary report by tech**  Left lower extremity venous duplex complete.  There is no evidence of deep or superficial vein thrombosis involving the left lower extremity. All visualized vessels appear patent and compressible. There is no evidence of a Baker's cyst on the left. Results were given to [facility] PA.  05/02/16 7:19 PM Olen Cordial RVT

## 2016-05-02 NOTE — ED Triage Notes (Signed)
Pt reports L foot pain for the past 3-4 weeks that has gradually gotten worse. No known injury. Pain worst in great toe and ankle with some swelling. Pt has also had difficulty with hyperglycemia. CBG 500 this am and was unreadable with EMS. No recent changed in medications. Has been taking medications as prescribed. Pt on PO medication and insulin.  EMS gave NS prior to arrival.

## 2016-05-02 NOTE — ED Notes (Signed)
Bed: WU98 Expected date:  Expected time:  Means of arrival:  Comments: EMS-foot pain/hyperglycemia

## 2016-05-02 NOTE — ED Provider Notes (Signed)
[facility]-EMERGENCY DEPT Provider Note   CSN: [id number] Arrival date & time: 05/02/16  1617     History   Chief Complaint Chief Complaint  Patient presents with  . Hyperglycemia  . Foot Pain    HPI John Dalton is a 56 y.o. male.  Patient presents with hyperglycemia and L ankle pain that has been going on for weeks but has progressively worsened today. States the swelling has increased and rates it as 10/10 sharp pain. He denies any trauma or recent injury to the area, and denies any numbness/tingling/weakness. Even hurts to bear weight on it today, although at baseline he is able to walk. States he has been urinating more and feels more thirsty but endorses compliance with his metformin and insulin. States he has to take 50 units of insulin three times daily to get his glucose under control. Endorses adequate fluid and food intake. Denies diarrhea, vomiting, abdominal pain. Denies history of blood clots, history of gout, chest pain, SOB, hemoptysis, recent illness, blood in stool.      Past Medical History:  Diagnosis Date  . Allergy   . Diabetes mellitus (HCC)   . Diverticulitis   . Environmental and seasonal allergies   . 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 HTN (hypertension)   . Hypercholesteremia     Patient Active Problem List   Diagnosis Date Noted  . Erectile dysfunction 12/29/2015  . Abscess of back 12/11/2015  . Heart palpitations 09/25/2015  . Stye external 09/25/2015  . Type 2 diabetes mellitus (HCC) 08/16/2015  . Encounter for routine adult medical exam with abnormal findings 08/16/2015  . Medicare annual wellness visit, subsequent 08/16/2015  . Hypertension, essential 08/16/2015  . Hyperlipidemia 08/16/2015  . Obesity 08/16/2015  . Impacted cerumen of both ears 08/16/2015    Past Surgical History:  Procedure Laterality Date  . Colon resection         Home Medications    Prior to Admission medications     Medication Sig Start Date End Date Taking? Authorizing Provider  amLODipine (NORVASC) 10 MG tablet Take 1 tablet (10 mg total) by mouth daily. 04/16/16  Yes Veryl Speak, FNP  aspirin EC 81 MG tablet Take 81 mg by mouth daily.   Yes Historical Provider, MD  atenolol (TENORMIN) 50 MG tablet Take 1 tablet (50 mg total) by mouth daily. 04/16/16  Yes Veryl Speak, FNP  gabapentin (NEURONTIN) 600 MG tablet Take 1 tablet (600 mg total) by mouth 2 (two) times daily. 12/11/15  Yes Veryl Speak, FNP  insulin NPH-regular Human (NOVOLIN 70/30) (70-30) 100 UNIT/ML injection Inject 45 Units into the skin 2 (two) times daily with a meal. Patient taking differently: Inject 50 Units into the skin 2 (two) times daily with a meal.  04/16/16  Yes Veryl Speak, FNP  lisinopril (PRINIVIL,ZESTRIL) 40 MG tablet Take 1 tablet (40 mg total) by mouth daily. 02/28/16  Yes Veryl Speak, FNP  metFORMIN (GLUCOPHAGE) 1000 MG tablet Take 1 tablet (1,000 mg total) by mouth 2 (two) times daily with a meal. 02/28/16  Yes Veryl Speak, FNP  pioglitazone (ACTOS) 30 MG tablet Take 1 tablet (30 mg total) by mouth daily. 02/28/16  Yes Veryl Speak, FNP  simvastatin (ZOCOR) 20 MG tablet Take 1 tablet (20 mg total) by mouth daily. 02/28/16  Yes Veryl Speak, FNP  Insulin Syringe-Needle U-100 (BD INSULIN SYRINGE ULTRAFINE) 31G X 15/64" 0.5 ML  MISC Use 1 needle per injection to inject insulin 1-4 times daily as instructed. 02/28/16   Veryl Speak, FNP  sildenafil (VIAGRA) 100 MG tablet Take 0.5-1 tablets (50-100 mg total) by mouth daily as needed for erectile dysfunction. Patient not taking: Reported on 05/02/2016 12/29/15   Veryl Speak, FNP  traMADol (ULTRAM) 50 MG tablet Take 1 tablet (50 mg total) by mouth every 6 (six) hours as needed for moderate pain or severe pain. 05/02/16   Dietrich Pates, PA-C    Family History Family History  Problem Relation Age of Onset  . Diabetes Father   . Hypertension Mother    . Hypertension Sister   . Diabetes Paternal Grandmother   . Hypertension Paternal Grandmother   . Diabetes Paternal Grandfather   . Hypertension Paternal Grandfather   . Hypertension Maternal Grandmother   . Hypertension Maternal Grandfather     Social History Social History  Substance Use Topics  . Smoking status: Current Some Day Smoker    Packs/day: 0.00    Years: 20.00    Types: Cigarettes  . Smokeless tobacco: Never Used  . Alcohol use No     Allergies   Patient has no known allergies.   Review of Systems Review of Systems  Constitutional: Negative for appetite change, chills and fever.  HENT: Negative for ear pain, rhinorrhea, sneezing and sore throat.   Eyes: Negative for photophobia and visual disturbance.  Respiratory: Negative for cough, chest tightness, shortness of breath and wheezing.   Cardiovascular: Negative for chest pain and palpitations.  Gastrointestinal: Negative for abdominal pain, blood in stool, constipation, diarrhea, nausea and vomiting.  Endocrine: Positive for polydipsia, polyphagia and polyuria.  Genitourinary: Negative for dysuria, hematuria and urgency.  Musculoskeletal: Positive for joint swelling and myalgias.  Skin: Positive for color change. Negative for rash and wound.  Neurological: Negative for dizziness, weakness and light-headedness.     Physical Exam Updated Vital Signs BP 139/68   Pulse 77   Temp 98.1 F (36.7 C) (Oral)   Resp 16   SpO2 98%   Physical Exam  Constitutional: He appears well-developed and well-nourished. No distress.  HENT:  Head: Normocephalic and atraumatic.  Nose: Nose normal.  Eyes: Conjunctivae and EOM are normal. Right eye exhibits no discharge. Left eye exhibits no discharge. No scleral icterus.  Neck: Normal range of motion. Neck supple.  Cardiovascular: Normal rate, regular rhythm, normal heart sounds and intact distal pulses.  Exam reveals no gallop and no friction rub.   No murmur  heard. Pulmonary/Chest: Effort normal and breath sounds normal. No respiratory distress.  Abdominal: Soft. Bowel sounds are normal. He exhibits no distension. There is no tenderness. There is no guarding.  Musculoskeletal: Normal range of motion. He exhibits edema and tenderness.       Left ankle: He exhibits swelling. He exhibits normal pulse. Tenderness.  Exquisite TTP of L ankle and big toe. Swelling noted. Good DP pulse. No evidence of wound or bite. Pain with ROM but able to have full PROM. No temperature or color change compared to other ankle.  Neurological: He is alert. He exhibits normal muscle tone. Coordination normal.  Skin: Skin is warm and dry. No rash noted.  Psychiatric: He has a normal mood and affect.  Nursing note and vitals reviewed.    ED Treatments / Results  Labs (all labs ordered are listed, but only abnormal results are displayed) Labs Reviewed  BASIC METABOLIC PANEL - Abnormal; Notable for the following:  Result Value   Sodium 132 (*)    Glucose, Bld 590 (*)    BUN 22 (*)    Creatinine, Ser 1.66 (*)    Calcium 8.8 (*)    GFR calc non Af Amer 45 (*)    GFR calc Af Amer 52 (*)    All other components within normal limits  CBC - Abnormal; Notable for the following:    Hemoglobin 12.9 (*)    HCT 36.9 (*)    MCV 76.9 (*)    All other components within normal limits  URINALYSIS, ROUTINE W REFLEX MICROSCOPIC - Abnormal; Notable for the following:    Color, Urine STRAW (*)    Glucose, UA >=500 (*)    All other components within normal limits  CBG MONITORING, ED - Abnormal; Notable for the following:    Glucose-Capillary 574 (*)    All other components within normal limits    EKG  EKG Interpretation None       Radiology Dg Ankle Complete Left  Result Date: 05/02/2016 CLINICAL DATA:  Left ankle pain and swelling for 2-3 weeks. EXAM: LEFT ANKLE COMPLETE - 3+ VIEW COMPARISON:  None. FINDINGS: No evidence of an acute fracture. No subluxation or  dislocation. Ankle mortise is preserved. Overlying soft tissue swelling is evident. IMPRESSION: Negative. Electronically Signed   By: Kennith Center M.D.   On: 05/02/2016 17:14    Procedures Procedures (including critical care time)  Medications Ordered in ED Medications  sodium chloride 0.9 % bolus 1,000 mL (0 mLs Intravenous Stopped 05/02/16 1836)  traMADol (ULTRAM) tablet 50 mg (50 mg Oral Given 05/02/16 1745)     Initial Impression / Assessment and Plan / ED Course  I have reviewed the triage vital signs and the nursing notes.  Pertinent labs & imaging results that were available during my care of the patient were reviewed by me and considered in my medical decision making (see chart for details).     Patient's history and symptoms concerning for hyperglycemia vs. DKA.  Ankle symptoms concerning for DVT vs. Cellulitis vs. Gout vs. Septic joint. CBC showed no leukocytosis or other abnormality. CBG was 574 on arrival. BMP revealed normal bicarb and anion gap. No evidence of DKA. Xray of ankle showed soft tissue swelling but no evidence of fracture or dislocation. LE venous u/s done and was negative for DVT. No evidence of infection due to no temperature or color change. Stable vital signs. Concerned for inflammation but due to renal function, unable to rx any anti-inflammatories. Will give rx for tramadol and advised to stay out of work for few days and apply heat to the area. Continue home meds including diabetes meds as previously rx. Return precautions given.    Final Clinical Impressions(s) / ED Diagnoses   Final diagnoses:  Foot pain, left    New Prescriptions New Prescriptions   TRAMADOL (ULTRAM) 50 MG TABLET    Take 1 tablet (50 mg total) by mouth every 6 (six) hours as needed for moderate pain or severe pain.     Dietrich Pates, PA-C 05/02/16 2142    Maia Plan, MD 05/03/16 1302

## 2016-05-02 NOTE — Discharge Instructions (Signed)
Take tramadol as needed for pain. Place heating pad on area to help with pain. Continue diabetes medications as previously rx. Return to ED for trouble breathing, fevers, coughing up blood, chest pain, additional injury,vision changes or worsening of symptoms.

## 2016-08-08 ENCOUNTER — Telehealth: Payer: Self-pay | Admitting: Family

## 2016-08-08 ENCOUNTER — Emergency Department (HOSPITAL_COMMUNITY): Payer: Medicare HMO

## 2016-08-08 ENCOUNTER — Encounter (HOSPITAL_COMMUNITY): Payer: Self-pay | Admitting: Emergency Medicine

## 2016-08-08 ENCOUNTER — Emergency Department (HOSPITAL_COMMUNITY)
Admission: EM | Admit: 2016-08-08 | Discharge: 2016-08-08 | Disposition: A | Discharge status: Home / Self Care | Payer: Medicare HMO | Attending: Emergency Medicine | Admitting: Emergency Medicine

## 2016-08-08 DIAGNOSIS — Z7982 Long term (current) use of aspirin: Secondary | ICD-10-CM | POA: Insufficient documentation

## 2016-08-08 DIAGNOSIS — E1165 Type 2 diabetes mellitus with hyperglycemia: Secondary | ICD-10-CM | POA: Diagnosis not present

## 2016-08-08 DIAGNOSIS — Z79899 Other long term (current) drug therapy: Secondary | ICD-10-CM | POA: Insufficient documentation

## 2016-08-08 DIAGNOSIS — Z794 Long term (current) use of insulin: Secondary | ICD-10-CM | POA: Insufficient documentation

## 2016-08-08 DIAGNOSIS — I10 Essential (primary) hypertension: Secondary | ICD-10-CM | POA: Insufficient documentation

## 2016-08-08 DIAGNOSIS — F17210 Nicotine dependence, cigarettes, uncomplicated: Secondary | ICD-10-CM | POA: Insufficient documentation

## 2016-08-08 DIAGNOSIS — R739 Hyperglycemia, unspecified: Secondary | ICD-10-CM

## 2016-08-08 DIAGNOSIS — E860 Dehydration: Secondary | ICD-10-CM | POA: Insufficient documentation

## 2016-08-08 LAB — BASIC METABOLIC PANEL
ANION GAP: 13 (ref 5–15)
BUN: 23 mg/dL — ABNORMAL HIGH (ref 6–20)
CALCIUM: 9.5 mg/dL (ref 8.9–10.3)
CO2: 21 mmol/L — AB (ref 22–32)
CREATININE: 1.45 mg/dL — AB (ref 0.61–1.24)
Chloride: 97 mmol/L — ABNORMAL LOW (ref 101–111)
GFR, EST NON AFRICAN AMERICAN: 52 mL/min — AB (ref >60)
GLUCOSE: 589 mg/dL — AB (ref 65–99)
Potassium: 4.6 mmol/L (ref 3.5–5.1)
Sodium: 131 mmol/L — ABNORMAL LOW (ref 135–145)

## 2016-08-08 LAB — CBC
HCT: 38.1 % — ABNORMAL LOW (ref 39.0–52.0)
Hemoglobin: 14.3 g/dL (ref 13.0–17.0)
MCH: 28.4 pg (ref 26.0–34.0)
MCHC: 37.5 g/dL — AB (ref 30.0–36.0)
MCV: 75.6 fL — ABNORMAL LOW (ref 78.0–100.0)
PLATELETS: 236 10*3/uL (ref 150–400)
RBC: 5.04 MIL/uL (ref 4.22–5.81)
RDW: 14.1 % (ref 11.5–15.5)
WBC: 7 10*3/uL (ref 4.0–10.5)

## 2016-08-08 LAB — URINALYSIS, ROUTINE W REFLEX MICROSCOPIC
BILIRUBIN URINE: NEGATIVE
Bacteria, UA: NONE SEEN
HGB URINE DIPSTICK: NEGATIVE
Ketones, ur: 5 mg/dL — AB
Leukocytes, UA: NEGATIVE
NITRITE: NEGATIVE
Protein, ur: NEGATIVE mg/dL
SPECIFIC GRAVITY, URINE: 1.021 (ref 1.005–1.030)
Squamous Epithelial / LPF: NONE SEEN
pH: 6 (ref 5.0–8.0)

## 2016-08-08 LAB — CBG MONITORING, ED
GLUCOSE-CAPILLARY: 468 mg/dL — AB (ref 65–99)
GLUCOSE-CAPILLARY: 359 mg/dL — AB (ref 65–99)
Glucose-Capillary: 570 mg/dL (ref 65–99)

## 2016-08-08 LAB — I-STAT TROPONIN, ED: TROPONIN I, POC: 0 ng/mL (ref 0.00–0.08)

## 2016-08-08 MED ORDER — SODIUM CHLORIDE 0.9 % IV BOLUS (SEPSIS)
1000.0000 mL | Freq: Once | INTRAVENOUS | Status: AC
  Administered 2016-08-08: 1000 mL via INTRAVENOUS

## 2016-08-08 MED ORDER — INSULIN ASPART 100 UNIT/ML ~~LOC~~ SOLN
10.0000 [IU] | Freq: Once | SUBCUTANEOUS | Status: AC
  Administered 2016-08-08: 10 [IU] via SUBCUTANEOUS
  Filled 2016-08-08: qty 1

## 2016-08-08 NOTE — ED Notes (Signed)
Patient O2 Sat not 84% 96%

## 2016-08-08 NOTE — ED Provider Notes (Signed)
[facility]-EMERGENCY DEPT Provider Note   CSN: [id number] Arrival date & time: 08/08/16  [id number]     History   Chief Complaint Chief Complaint  Patient presents with  . Hyperglycemia    HPI John Dalton is a 56 y.o. male.  HPI   56 yo M with h/o DM, HTN, HLD here with hypeglycemia. Pt states he has been unable to afford his NPH this month. He ran out of insulin 1 week ago and since then has had worsening thirst, polyuria, and general fatigue. He has been seeing spots in his vision over this time period as well, which come and go. He has also noticed increased numbness and aching, burning, stabbing pain in his bilateral feet. He was well prior to running out of his insulin. He has not changed any of his medications recently. Denies any other illnesses. No recent fevers or chills. No cough or SOB. No sick contacts. No dysuria, only polyuria.  Past Medical History:  Diagnosis Date  . Allergy   . Diabetes mellitus (HCC)   . Diverticulitis   . Environmental and seasonal allergies   . 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 HTN (hypertension)   . Hypercholesteremia     Patient Active Problem List   Diagnosis Date Noted  . Erectile dysfunction 12/29/2015  . Abscess of back 12/11/2015  . Heart palpitations 09/25/2015  . Stye external 09/25/2015  . Type 2 diabetes mellitus (HCC) 08/16/2015  . Encounter for routine adult medical exam with abnormal findings 08/16/2015  . Medicare annual wellness visit, subsequent 08/16/2015  . Hypertension, essential 08/16/2015  . Hyperlipidemia 08/16/2015  . Obesity 08/16/2015  . Impacted cerumen of both ears 08/16/2015    Past Surgical History:  Procedure Laterality Date  . Colon resection         Home Medications    Prior to Admission medications   Medication Sig Start Date End Date Taking? Authorizing Provider  amLODipine (NORVASC) 10 MG tablet Take 1 tablet (10 mg total) by mouth daily. 04/16/16    Veryl Speakalone, Gregory D, FNP  aspirin EC 81 MG tablet Take 81 mg by mouth daily.    [provider]  atenolol (TENORMIN) 50 MG tablet Take 1 tablet (50 mg total) by mouth daily. 04/16/16   Veryl Speakalone, Gregory D, FNP  gabapentin (NEURONTIN) 600 MG tablet Take 1 tablet (600 mg total) by mouth 2 (two) times daily. 12/11/15   Veryl Speakalone, Gregory D, FNP  insulin NPH-regular Human (NOVOLIN 70/30) (70-30) 100 UNIT/ML injection Inject 45 Units into the skin 2 (two) times daily with a meal. Patient taking differently: Inject 50 Units into the skin 2 (two) times daily with a meal.  04/16/16   Veryl Speakalone, Gregory D, FNP  Insulin Syringe-Needle U-100 (BD INSULIN SYRINGE ULTRAFINE) 31G X 15/64" 0.5 ML MISC Use 1 needle per injection to inject insulin 1-4 times daily as instructed. 02/28/16   Veryl Speakalone, Gregory D, FNP  lisinopril (PRINIVIL,ZESTRIL) 40 MG tablet Take 1 tablet (40 mg total) by mouth daily. 02/28/16   Veryl Speakalone, Gregory D, FNP  metFORMIN (GLUCOPHAGE) 1000 MG tablet Take 1 tablet (1,000 mg total) by mouth 2 (two) times daily with a meal. 02/28/16   Veryl Speakalone, Gregory D, FNP  pioglitazone (ACTOS) 30 MG tablet Take 1 tablet (30 mg total) by mouth daily. 02/28/16   Veryl Speakalone, Gregory D, FNP  sildenafil (VIAGRA) 100 MG tablet Take 0.5-1 tablets (50-100 mg total) by mouth daily as needed for erectile  dysfunction. Patient not taking: Reported on 05/02/2016 12/29/15   Veryl Speak, FNP  simvastatin (ZOCOR) 20 MG tablet Take 1 tablet (20 mg total) by mouth daily. 02/28/16   Veryl Speak, FNP  traMADol (ULTRAM) 50 MG tablet Take 1 tablet (50 mg total) by mouth every 6 (six) hours as needed for moderate pain or severe pain. 05/02/16   Dietrich Pates, PA-C    Family History Family History  Problem Relation Age of Onset  . Diabetes Father   . Hypertension Mother   . Hypertension Sister   . Diabetes Paternal Grandmother   . Hypertension Paternal Grandmother   . Diabetes Paternal Grandfather   . Hypertension Paternal Grandfather     . Hypertension Maternal Grandmother   . Hypertension Maternal Grandfather     Social History Social History  Substance Use Topics  . Smoking status: Current Some Day Smoker    Packs/day: 0.00    Years: 20.00    Types: Cigarettes  . Smokeless tobacco: Never Used  . Alcohol use No     Allergies   Patient has no known allergies.   Review of Systems Review of Systems  Constitutional: Positive for appetite change and fatigue.  Eyes: Positive for visual disturbance.  Endocrine: Positive for polydipsia, polyphagia and polyuria.  Neurological: Positive for weakness.  All other systems reviewed and are negative.    Physical Exam Updated Vital Signs BP (!) 146/78   Pulse 77   Temp 98 F (36.7 C) (Oral)   Resp (!) 22   Ht 5\' 10"  (1.778 m)   Wt 95.3 kg (210 lb)   SpO2 96%   BMI 30.13 kg/m   Physical Exam  Constitutional: He is oriented to person, place, and time. He appears well-developed and well-nourished. No distress.  HENT:  Head: Normocephalic and atraumatic.  Moderately dry MM  Eyes: Conjunctivae are normal.  Neck: Neck supple.  Cardiovascular: Normal rate, regular rhythm and normal heart sounds.  Exam reveals no friction rub.   No murmur heard. Pulmonary/Chest: Effort normal and breath sounds normal. No respiratory distress. He has no wheezes. He has no rales.  Abdominal: Soft. Bowel sounds are normal. He exhibits no distension. There is no tenderness. There is no guarding.  Musculoskeletal: He exhibits no edema.  Neurological: He is alert and oriented to person, place, and time. He exhibits normal muscle tone.  Skin: Skin is warm. Capillary refill takes less than 2 seconds.  No lesions or sores to bilateral feet. No ulcerations.  Psychiatric: He has a normal mood and affect.  Nursing note and vitals reviewed.    ED Treatments / Results  Labs (all labs ordered are listed, but only abnormal results are displayed) Labs Reviewed  BASIC METABOLIC PANEL -  Abnormal; Notable for the following:       Result Value   Sodium 131 (*)    Chloride 97 (*)    CO2 21 (*)    Glucose, Bld 589 (*)    BUN 23 (*)    Creatinine, Ser 1.45 (*)    GFR calc non Af Amer 52 (*)    All other components within normal limits  CBC - Abnormal; Notable for the following:    HCT 38.1 (*)    MCV 75.6 (*)    MCHC 37.5 (*)    All other components within normal limits  URINALYSIS, ROUTINE W REFLEX MICROSCOPIC - Abnormal; Notable for the following:    Glucose, UA >=500 (*)    Ketones, ur  5 (*)    All other components within normal limits  CBG MONITORING, ED - Abnormal; Notable for the following:    Glucose-Capillary 570 (*)    All other components within normal limits  CBG MONITORING, ED - Abnormal; Notable for the following:    Glucose-Capillary 468 (*)    All other components within normal limits  CBG MONITORING, ED - Abnormal; Notable for the following:    Glucose-Capillary 359 (*)    All other components within normal limits  I-STAT TROPOININ, ED    EKG  EKG Interpretation  Date/Time:  Thursday August 08 2016 09:32:59 EDT Ventricular Rate:  72 PR Interval:    QRS Duration: 91 QT Interval:  383 QTC Calculation: 420 R Axis:   36 Text Interpretation:  Sinus rhythm Nonspecific T abnrm, anterolateral leads No old tracing to compare Confirmed by Shaune Pollack [phone]) on 08/08/2016 10:39:33 AM       Radiology Dg Chest 2 View  Result Date: 08/08/2016 CLINICAL DATA:  Worsening hyperglycemia over last several days, BILATERAL foot numbness, decreased oxygen saturation; history hypertension, type II diabetes mellitus, occasional smoker EXAM: CHEST  2 VIEW COMPARISON:  None FINDINGS: Normal heart size, mediastinal contours, and pulmonary vascularity. Lungs clear. No pleural effusion or pneumothorax. Osseous structures unremarkable. IMPRESSION: No acute abnormalities. Electronically Signed   By: Ulyses Southward M.D.   On: 08/08/2016 10:50    Procedures Procedures  (including critical care time)  Medications Ordered in ED Medications  sodium chloride 0.9 % bolus 1,000 mL (0 mLs Intravenous Stopped 08/08/16 1001)  insulin aspart (novoLOG) injection 10 Units (10 Units Subcutaneous Given 08/08/16 0827)  sodium chloride 0.9 % bolus 1,000 mL (0 mLs Intravenous Stopped 08/08/16 1002)  insulin aspart (novoLOG) injection 10 Units (10 Units Subcutaneous Given 08/08/16 1006)     Initial Impression / Assessment and Plan / ED Course  I have reviewed the triage vital signs and the nursing notes.  Pertinent labs & imaging results that were available during my care of the patient were reviewed by me and considered in my medical decision making (see chart for details).     56 yo M with PMHx HTN, HLD, DM2 here with hyperglycemia and subsequent fatigue, polyuria, and polydipsia. Lab work as above, remarkable for significant hyperglycemia with only minimally decreased bicarb (CO2 21), normal AG, and no evidence of significant DKA. UA with only 5 ketones likely 2/2 dehydration. No apparent infectious trigger and pt admits he has been out of his insulin. Renal function at baseline. Pt given 10u insulin x 2 (takes 25u 70/30 at home), IVF with sx improvement. Attempted CM/SW consult for insulin as pt states he cannot afford his $30 insulin, but due to pt having insurance, he is ineligible for assistance. D/w his pharmacy at [facility] who states cheapest option is $24. I subsequently d/w pt's PCP at [facility] who states to have pt call after d/c to arrange possible sample pick-up/prescription at [facility] for discount. This was explained to pt in detail who is in agreement. Repeat BG improving. Trop neg, no CP - doubt ACS. Will d/c with outpt follow-up.  Final Clinical Impressions(s) / ED Diagnoses   Final diagnoses:  Hyperglycemia  Dehydration    New Prescriptions New Prescriptions   No medications on file     Shaune Pollack, MD 08/08/16 1114

## 2016-08-08 NOTE — ED Notes (Signed)
RN notified of CBG result 

## 2016-08-08 NOTE — Telephone Encounter (Signed)
Pt called stating he was supposed to call and come pick up some insulin he has just left the ER, I spoke with greg and he states he has to change his regimen around and to tell the Pt we will call him back, pt was notified of this.

## 2016-08-08 NOTE — Discharge Instructions (Signed)
-   CALL YOUR PRIMARY OFFICE IMMEDIATELY AT DISCHARGE TO DISCUSS HOW TO GET YOUR INSULIN; IT IS VERY IMPORTANT THAT YOU FILL AND TAKE YOUR INSULIN TO PREVENT HOSPITALIZATION, ILLNESS, AND POSSIBLY DEATH - TRY TO WATCH YOUR CARBOHYDRATE/SUGAR INTAKE, ESPECIALLY WHILE WAITING FOR YOUR INSULIN - RETURN TO THE ER AS NEEDED

## 2016-08-08 NOTE — Progress Notes (Signed)
CSW received consult for " Unable to afford insulin until 1st of next month, here now with worsening hyperglycemia This is a care manager problem but they cannot help him because he has insurance". CSW informed EDP to [facility] for [facility]. CSW signing off.   Stacy GardnerErin Chelcy Bolda, [facility] LPCSWA Emergency Room Clinical Social Worker [phone]

## 2016-08-08 NOTE — Telephone Encounter (Signed)
Please inform patient that we will have to change his medication regimen and can provide him with a sample of Lantus as we do not have 70/30.

## 2016-08-08 NOTE — Discharge Planning (Signed)
[facility].  Pt has insurance coverage and is not eligible for medication assistance [facility](MATCH) program. No further CM needs communicated at this time.

## 2016-08-08 NOTE — ED Triage Notes (Signed)
Pt verbalizes worsening hyperglycemia, hunger, and bilateral foot numbness/pain ongoing for a month; has not had insulin for a week.

## 2016-08-09 NOTE — Telephone Encounter (Signed)
Let pt know that he has insulin samples here for pick up. Advised per Tammy SoursGreg that he his to take 30 units daily and go up 1 unit daily for BS that are greater than 180. Pt states understanding and will be by to pick them up.

## 2016-08-09 NOTE — Telephone Encounter (Signed)
Pt was put on Toujeo due to lantus being out. Ran directions by Dr. Lawerance BachBurns. She advised pt keep close check on BS and [place] with readings. Tried calling pt to let him know. Pt was not in will try back shortly.

## 2016-08-23 ENCOUNTER — Telehealth: Payer: Self-pay | Admitting: Family

## 2016-08-23 NOTE — Telephone Encounter (Signed)
Pt is going to come and pick up basaglar due to us being out of toujeo at the moment.

## 2016-08-23 NOTE — Telephone Encounter (Signed)
Pt called and would like to know if he can get one more sample of the Toujeo pen.  He would be able to get his starting next month

## 2016-11-27 ENCOUNTER — Telehealth: Payer: Self-pay | Admitting: Family

## 2016-11-27 DIAGNOSIS — E1142 Type 2 diabetes mellitus with diabetic polyneuropathy: Secondary | ICD-10-CM

## 2016-11-27 MED ORDER — GABAPENTIN 600 MG PO TABS: 600.0000 mg | ORAL_TABLET | Freq: Two times a day (BID) | ORAL | 0 refills | Status: DC

## 2016-11-27 MED ORDER — METFORMIN HCL 1000 MG PO TABS: 1000.0000 mg | ORAL_TABLET | Freq: Two times a day (BID) | ORAL | 0 refills | Status: DC

## 2016-11-27 MED ORDER — LISINOPRIL 40 MG PO TABS: 40.0000 mg | ORAL_TABLET | Freq: Every day | ORAL | 0 refills | Status: DC

## 2016-11-27 MED ORDER — PIOGLITAZONE HCL 30 MG PO TABS: 30.0000 mg | ORAL_TABLET | Freq: Every day | ORAL | 0 refills | Status: DC

## 2016-11-27 MED ORDER — SIMVASTATIN 20 MG PO TABS: 20.0000 mg | ORAL_TABLET | Freq: Every day | ORAL | 0 refills | Status: DC

## 2016-11-27 MED ORDER — ATENOLOL 50 MG PO TABS: 50.0000 mg | ORAL_TABLET | Freq: Every day | ORAL | 0 refills | Status: DC

## 2016-11-27 MED ORDER — INSULIN NPH ISOPHANE & REGULAR (70-30) 100 UNIT/ML ~~LOC~~ SUSP: 50.0000 [IU] | Freq: Two times a day (BID) | SUBCUTANEOUS | 0 refills | Status: DC

## 2016-11-27 MED ORDER — AMLODIPINE BESYLATE 10 MG PO TABS: 10.0000 mg | ORAL_TABLET | Freq: Every day | ORAL | 0 refills | Status: DC

## 2016-11-27 NOTE — Telephone Encounter (Signed)
Sent 1 week worth until appt on all maintenance meds...lmb

## 2016-11-27 NOTE — Telephone Encounter (Signed)
Pt called requesting refills on the following medications: insulin NPH-regular Human (NOVOLIN 70/30) (70-30) 100 UNIT/ML injection metFORMIN (GLUCOPHAGE) 1000 MG tablet gabapentin (NEURONTIN) 600 MG tablet amLODipine (NORVASC) 10 MG tablet pioglitazone (ACTOS) 30 MG tablet lisinopril (PRINIVIL,ZESTRIL) 40 MG tablet simvastatin (ZOCOR) 20 MG tablet atenolol (TENORMIN) 50 MG [facility] [address]  He is scheduled to see Dr Jonny RuizJohn on Monday but said that he is out of these medications and has been out. He said that the pharmacy has requested these refills already.

## 2016-12-02 ENCOUNTER — Ambulatory Visit: Payer: Medicare HMO | Admitting: Internal Medicine

## 2016-12-13 ENCOUNTER — Emergency Department (HOSPITAL_COMMUNITY)
Admission: EM | Admit: 2016-12-13 | Discharge: 2016-12-13 | Disposition: A | Discharge status: Home / Self Care | Payer: Medicare HMO | Attending: Emergency Medicine | Admitting: Emergency Medicine

## 2016-12-13 ENCOUNTER — Encounter (HOSPITAL_COMMUNITY): Payer: Self-pay

## 2016-12-13 ENCOUNTER — Other Ambulatory Visit: Payer: Self-pay

## 2016-12-13 ENCOUNTER — Emergency Department (HOSPITAL_COMMUNITY): Payer: Medicare HMO

## 2016-12-13 DIAGNOSIS — E119 Type 2 diabetes mellitus without complications: Secondary | ICD-10-CM | POA: Insufficient documentation

## 2016-12-13 DIAGNOSIS — F17210 Nicotine dependence, cigarettes, uncomplicated: Secondary | ICD-10-CM | POA: Diagnosis not present

## 2016-12-13 DIAGNOSIS — Z7982 Long term (current) use of aspirin: Secondary | ICD-10-CM | POA: Diagnosis not present

## 2016-12-13 DIAGNOSIS — R079 Chest pain, unspecified: Secondary | ICD-10-CM | POA: Diagnosis not present

## 2016-12-13 DIAGNOSIS — R739 Hyperglycemia, unspecified: Secondary | ICD-10-CM

## 2016-12-13 DIAGNOSIS — I10 Essential (primary) hypertension: Secondary | ICD-10-CM | POA: Insufficient documentation

## 2016-12-13 DIAGNOSIS — Z794 Long term (current) use of insulin: Secondary | ICD-10-CM | POA: Insufficient documentation

## 2016-12-13 DIAGNOSIS — E1165 Type 2 diabetes mellitus with hyperglycemia: Secondary | ICD-10-CM | POA: Diagnosis not present

## 2016-12-13 LAB — CBG MONITORING, ED
GLUCOSE-CAPILLARY: 339 mg/dL — AB (ref 65–99)
Glucose-Capillary: 550 mg/dL (ref 65–99)

## 2016-12-13 LAB — BASIC METABOLIC PANEL
ANION GAP: 14 (ref 5–15)
BUN: 22 mg/dL — ABNORMAL HIGH (ref 6–20)
CHLORIDE: 98 mmol/L — AB (ref 101–111)
CO2: 17 mmol/L — AB (ref 22–32)
Calcium: 9.1 mg/dL (ref 8.9–10.3)
Creatinine, Ser: 1.88 mg/dL — ABNORMAL HIGH (ref 0.61–1.24)
GFR calc Af Amer: 44 mL/min — ABNORMAL LOW (ref >60)
GFR, EST NON AFRICAN AMERICAN: 38 mL/min — AB (ref >60)
GLUCOSE: 548 mg/dL — AB (ref 65–99)
POTASSIUM: 5 mmol/L (ref 3.5–5.1)
Sodium: 129 mmol/L — ABNORMAL LOW (ref 135–145)

## 2016-12-13 LAB — CBC
HEMATOCRIT: 41.3 % (ref 39.0–52.0)
HEMOGLOBIN: 14.7 g/dL (ref 13.0–17.0)
MCH: 28 pg (ref 26.0–34.0)
MCHC: 35.6 g/dL (ref 30.0–36.0)
MCV: 78.7 fL (ref 78.0–100.0)
Platelets: 242 10*3/uL (ref 150–400)
RBC: 5.25 MIL/uL (ref 4.22–5.81)
RDW: 13.7 % (ref 11.5–15.5)
WBC: 6.7 10*3/uL (ref 4.0–10.5)

## 2016-12-13 LAB — I-STAT TROPONIN, ED
Troponin i, poc: 0.01 ng/mL (ref 0.00–0.08)
Troponin i, poc: 0.02 ng/mL (ref 0.00–0.08)

## 2016-12-13 MED ORDER — INSULIN ASPART 100 UNIT/ML ~~LOC~~ SOLN
8.0000 [IU] | Freq: Once | SUBCUTANEOUS | Status: AC
  Administered 2016-12-13: 8 [IU] via SUBCUTANEOUS
  Filled 2016-12-13: qty 1

## 2016-12-13 MED ORDER — SODIUM CHLORIDE 0.9 % IV BOLUS (SEPSIS)
1000.0000 mL | Freq: Once | INTRAVENOUS | Status: AC
  Administered 2016-12-13: 1000 mL via INTRAVENOUS

## 2016-12-13 NOTE — ED Provider Notes (Signed)
[facility]-EMERGENCY DEPT Provider Note   CSN: [id number] Arrival date & time: 12/13/16  1428     History   Chief Complaint Chief Complaint  Patient presents with  . Chest Pain    HPI John Dalton is a 56 y.o. male.  HPI Patient presents to the emergency room for evaluation of chest pain.  Patient does have a history of diabetes, hypertension and hypercholesterolemia.  Patient states he has been having intermittent chest pain for the last month.  It comes and goes.  It lasts a few minutes.  Exertion does seem to bring on the symptoms.  Patient denies any abdominal pain.  He states he did have an episode of vomiting the other day but none since.  He had some intermittent dizziness and shortness of breath.  Patient states he has a history of a heart attack 20 years ago.  However, patient states he never had any cardiac stents or other procedures.  He does not see a cardiologist.  Myocardial infarction is not listed in his medical records.  Patient has been out of his medications and not taking them recently.  Medical records indicate that his doctor prescribed him 1 week worth of refills on October 31.   Past Medical History:  Diagnosis Date  . Allergy   . Diabetes mellitus (HCC)   . Diverticulitis   . Environmental and seasonal allergies   . 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 HTN (hypertension)   . Hypercholesteremia     Patient Active Problem List   Diagnosis Date Noted  . Erectile dysfunction 12/29/2015  . Abscess of back 12/11/2015  . Heart palpitations 09/25/2015  . Stye external 09/25/2015  . Type 2 diabetes mellitus (HCC) 08/16/2015  . Encounter for routine adult medical exam with abnormal findings 08/16/2015  . Medicare annual wellness visit, subsequent 08/16/2015  . Hypertension, essential 08/16/2015  . Hyperlipidemia 08/16/2015  . Obesity 08/16/2015  . Impacted cerumen of both ears 08/16/2015     Past Surgical History:  Procedure Laterality Date  . Colon resection         Home Medications    Prior to Admission medications   Medication Sig Start Date End Date Taking? Authorizing Provider  amLODipine (NORVASC) 10 MG tablet Take 1 tablet (10 mg total) by mouth daily. Must keep appt w/new provider for refills 11/27/16   Corwin Levins, MD  aspirin EC 81 MG tablet Take 81 mg by mouth daily.    [provider]  atenolol (TENORMIN) 50 MG tablet Take 1 tablet (50 mg total) by mouth daily. Must keep appt w/new provider for refills 11/27/16   Corwin Levins, MD  gabapentin (NEURONTIN) 600 MG tablet Take 1 tablet (600 mg total) by mouth 2 (two) times daily. Must keep appt w/new provider for refills 11/27/16   Corwin Levins, MD  insulin NPH-regular Human (NOVOLIN 70/30) (70-30) 100 UNIT/ML injection Inject 50 Units into the skin 2 (two) times daily with a meal. Must keep appt w/new provider for refills 11/27/16   Corwin Levins, MD  Insulin Syringe-Needle U-100 (BD INSULIN SYRINGE ULTRAFINE) 31G X 15/64" 0.5 ML MISC Use 1 needle per injection to inject insulin 1-4 times daily as instructed. 02/28/16   Veryl Speak, FNP  lisinopril (PRINIVIL,ZESTRIL) 40 MG tablet Take 1 tablet (40 mg total) by mouth daily. Must keep appt w/new provider for refills 11/27/16   Corwin Levins, MD  metFORMIN (GLUCOPHAGE) 1000 MG tablet Take 1 tablet (1,000 mg total) by mouth 2 (two) times daily with a meal. Must keep appt w/new provider for refills 11/27/16   Corwin LevinsJohn, James W, MD  pioglitazone (ACTOS) 30 MG tablet Take 1 tablet (30 mg total) by mouth daily. Must keep appt w/new provider for refills 11/27/16   Corwin LevinsJohn, James W, MD  sildenafil (VIAGRA) 100 MG tablet Take 0.5-1 tablets (50-100 mg total) by mouth daily as needed for erectile dysfunction. Patient not taking: Reported on 05/02/2016 12/29/15   Veryl Speakalone, Gregory D, FNP  simvastatin (ZOCOR) 20 MG tablet Take 1 tablet (20 mg total) by mouth daily. Must keep  appt w/new provider for refills 11/27/16   Corwin LevinsJohn, James W, MD  traMADol (ULTRAM) 50 MG tablet Take 1 tablet (50 mg total) by mouth every 6 (six) hours as needed for moderate pain or severe pain. 05/02/16   Dietrich PatesKhatri, Hina, PA-C    Family History Family History  Problem Relation Age of Onset  . Diabetes Father   . Hypertension Mother   . Hypertension Sister   . Diabetes Paternal Grandmother   . Hypertension Paternal Grandmother   . Diabetes Paternal Grandfather   . Hypertension Paternal Grandfather   . Hypertension Maternal Grandmother   . Hypertension Maternal Grandfather     Social History Social History   Tobacco Use  . Smoking status: Current Some Day Smoker    Packs/day: 0.00    Years: 20.00    Pack years: 0.00    Types: Cigarettes  . Smokeless tobacco: Never Used  Substance Use Topics  . Alcohol use: No    Alcohol/week: 0.0 oz  . Drug use: No     Allergies   Patient has no known allergies.   Review of Systems Review of Systems  Neurological:       Numbness in his toes  All other systems reviewed and are negative.    Physical Exam Updated Vital Signs BP (!) 162/97 (BP Location: Left Arm)   Pulse 89   Temp 98.5 F (36.9 C) (Oral)   Resp 16   Ht 1.791 m (5' 10.5")   Wt 45.4 kg (100 lb)   SpO2 96%   BMI 14.15 kg/m   Physical Exam  Constitutional: He appears well-developed and well-nourished. No distress.  HENT:  Head: Normocephalic and atraumatic.  Right Ear: External ear normal.  Left Ear: External ear normal.  Eyes: Conjunctivae are normal. Right eye exhibits no discharge. Left eye exhibits no discharge. No scleral icterus.  Neck: Neck supple. No tracheal deviation present.  Cardiovascular: Normal rate, regular rhythm and intact distal pulses.  Pulmonary/Chest: Effort normal and breath sounds normal. No stridor. No respiratory distress. He has no wheezes. He has no rales.  Abdominal: Soft. Bowel sounds are normal. He exhibits no distension. There is  no tenderness. There is no rebound and no guarding.  Musculoskeletal: He exhibits no edema or tenderness.  Neurological: He is alert. He has normal strength. No cranial nerve deficit (no facial droop, extraocular movements intact, no slurred speech) or sensory deficit. He exhibits normal muscle tone. He displays no seizure activity. Coordination normal.  Skin: Skin is warm and dry. No rash noted.  Psychiatric: He has a normal mood and affect.  Nursing note and vitals reviewed.    ED Treatments / Results  Labs (all labs ordered are listed, but only abnormal results are displayed) Labs Reviewed  BASIC METABOLIC PANEL - Abnormal; Notable for the following components:  Result Value   Sodium 129 (*)    Chloride 98 (*)    CO2 17 (*)    Glucose, Bld 548 (*)    BUN 22 (*)    Creatinine, Ser 1.88 (*)    GFR calc non Af Amer 38 (*)    GFR calc Af Amer 44 (*)    All other components within normal limits  CBG MONITORING, ED - Abnormal; Notable for the following components:   Glucose-Capillary 550 (*)    All other components within normal limits  CBC  I-STAT TROPONIN, ED    EKG  EKG Interpretation  Date/Time:  Friday December 13 2016 14:52:16 EST Ventricular Rate:  88 PR Interval:    QRS Duration: 78 QT Interval:  364 QTC Calculation: 441 R Axis:   -20 Text Interpretation:  Sinus rhythm Borderline left axis deviation t wave changes on lateral leads noted previously not present today Confirmed by Linwood DibblesKnapp, Rileyann Florance [id number]) on 12/13/2016 3:11:52 PM       Radiology Dg Chest 2 View  Result Date: 12/13/2016 CLINICAL DATA:  Intermittent left-sided chest pain with shortness of Breath EXAM: CHEST  2 VIEW COMPARISON:  08/08/16 FINDINGS: The heart size and mediastinal contours are within normal limits. Both lungs are clear. The visualized skeletal structures are unremarkable. IMPRESSION: No active cardiopulmonary disease. Electronically Signed   By: Alcide CleverMark  Lukens M.D.   On: 12/13/2016 15:16     Procedures Procedures (including critical care time)  Medications Ordered in ED Medications  insulin aspart (novoLOG) injection 8 Units (not administered)  sodium chloride 0.9 % bolus 1,000 mL (not administered)     Initial Impression / Assessment and Plan / ED Course  I have reviewed the triage vital signs and the nursing notes.  Pertinent labs & imaging results that were available during my care of the patient were reviewed by me and considered in my medical decision making (see chart for details).  Clinical Course as of Dec 14 1619  Fri Dec 13, 2016  1454 Moderate risk per heart score  [JK]    Clinical Course User Index [JK] Linwood DibblesKnapp, Bekka Qian, MD    Sx ongoing for one month.  He does have risk factors but the entire picture is not suggestive of ACS.  Initial trop normal. Will check delta troponin.  Dr Fayrene FearingJames will follow up on the results.  Final Clinical Impressions(s) / ED Diagnoses   Final diagnoses:  Chest pain, unspecified type  Hyperglycemia      Linwood DibblesKnapp, Olisa Quesnel, MD 12/13/16 1622

## 2016-12-13 NOTE — ED Triage Notes (Signed)
Pt BIB EMS from home. Pt reports intermittent left sided chest pain and toe numbness x1 month. Pt has not taken medications (insulin and cardiac meds) x1 month, as he was waiting for social security to come through before refilling medications. Emesis x1 yesterday. Hx of diabetes and MI. Reports some dizziness and shortness of breath. EMS CBG was 582. A/Ox4.

## 2016-12-13 NOTE — Discharge Instructions (Signed)
Call your primary care provider about the possibility of assistance with your medications.

## 2016-12-13 NOTE — ED Notes (Addendum)
Date and time results received: 12/13/16 4:20 PM   Test: glucose Critical Value: 548  Name of Provider Notified: MD Lynelle DoctorKnapp  Orders Received? Or Actions Taken?: Orders paced

## 2016-12-13 NOTE — ED Notes (Signed)
Bed: [id number] Expected date:  Expected time:  Means of arrival:  Comments: EMS- Chest pain

## 2016-12-13 NOTE — ED Notes (Signed)
Pt was discharged and given d/c instructions after AMA signuature was complete. Pt was not d/c'd AMA. Pt verbalizes understanding of d/c paperwork, follow up instructions, and medications. Pt A/O x4, ambulatory. All belongings with patient upon departure.

## 2016-12-13 NOTE — ED Provider Notes (Signed)
Recheck blood sugar improved. Second troponin normal. Patient asked to be discharged. Encouraged to go to community health regarding assistance with his medications. He states an understanding that his blood sugar will be going near controlled in the interval that his planning until the end of the month.   Rolland PorterJames, Keionte Swicegood, MD 12/13/16 [id number]

## 2016-12-30 ENCOUNTER — Ambulatory Visit: Payer: Medicare HMO | Admitting: Internal Medicine

## 2017-01-11 ENCOUNTER — Ambulatory Visit (INDEPENDENT_AMBULATORY_CARE_PROVIDER_SITE_OTHER): Payer: Medicare HMO | Admitting: Internal Medicine

## 2017-01-11 ENCOUNTER — Encounter: Payer: Self-pay | Admitting: Internal Medicine

## 2017-01-11 VITALS — BP 134/82 | HR 93 | Temp 98.0°F | Ht 70.5 in | Wt 204.0 lb

## 2017-01-11 DIAGNOSIS — E1142 Type 2 diabetes mellitus with diabetic polyneuropathy: Secondary | ICD-10-CM | POA: Diagnosis not present

## 2017-01-11 DIAGNOSIS — I10 Essential (primary) hypertension: Secondary | ICD-10-CM

## 2017-01-11 DIAGNOSIS — E782 Mixed hyperlipidemia: Secondary | ICD-10-CM | POA: Diagnosis not present

## 2017-01-11 DIAGNOSIS — G629 Polyneuropathy, unspecified: Secondary | ICD-10-CM

## 2017-01-11 DIAGNOSIS — Z0001 Encounter for general adult medical examination with abnormal findings: Secondary | ICD-10-CM

## 2017-01-11 DIAGNOSIS — Z114 Encounter for screening for human immunodeficiency virus [HIV]: Secondary | ICD-10-CM

## 2017-01-11 HISTORY — DX: Polyneuropathy, unspecified: G62.9

## 2017-01-11 MED ORDER — INSULIN NPH ISOPHANE & REGULAR (70-30) 100 UNIT/ML ~~LOC~~ SUSP: 50.0000 [IU] | Freq: Two times a day (BID) | SUBCUTANEOUS | 11 refills | Status: DC

## 2017-01-11 MED ORDER — ATENOLOL 50 MG PO TABS: 50.0000 mg | ORAL_TABLET | Freq: Every day | ORAL | 3 refills | Status: DC

## 2017-01-11 MED ORDER — GABAPENTIN 600 MG PO TABS: 600.0000 mg | ORAL_TABLET | Freq: Three times a day (TID) | ORAL | 1 refills | Status: DC

## 2017-01-11 MED ORDER — METFORMIN HCL 1000 MG PO TABS: 1000.0000 mg | ORAL_TABLET | Freq: Two times a day (BID) | ORAL | 3 refills | Status: DC

## 2017-01-11 MED ORDER — SIMVASTATIN 20 MG PO TABS: 20.0000 mg | ORAL_TABLET | Freq: Every day | ORAL | 3 refills | Status: DC

## 2017-01-11 MED ORDER — LISINOPRIL 40 MG PO TABS: 40.0000 mg | ORAL_TABLET | Freq: Every day | ORAL | 3 refills | Status: DC

## 2017-01-11 NOTE — Patient Instructions (Addendum)
OK to stop the actos as you have  OK to increase the gabapentin to three times per day  OK to HOLD on taking the amlodipine for now, though this may need to be restarted in the future  Please continue all other medications as before, including restarting the metformin, and refills have been done if requested for all meds  Please have the pharmacy call with any other refills you may need.  Please continue your efforts at being more active, low cholesterol diet, and weight control.  You are otherwise up to date with prevention measures today.  Please keep your appointments with your specialists as you may have planned  You will be contacted regarding the referral for: colonoscopy, and podiatry foot doctor  Please go to the LAB in the Basement (turn left off the elevator) for the tests to be done at your earliest convenience next week  You will be contacted by phone if any changes need to be made immediately.  Otherwise, you will receive a letter about your results with an explanation, but please check with MyChart first.  Please remember to sign up for MyChart if you have not done so, as this will be important to you in the future with finding out test results, communicating by private email, and scheduling acute appointments online when needed.  Please return in 3 months, or sooner if needed, with Lab testing done 3-5 days before

## 2017-01-11 NOTE — Progress Notes (Signed)
Subjective:    Patient ID: John Dalton, male    DOB: 15-Dec-1960, 56 y.o.   MRN: [id number]  HPI  Here for wellness and establish with new PCP;  Overall doing ok;  Pt denies Chest pain, worsening SOB, DOE, wheezing, orthopnea, PND, worsening LE edema, palpitations, dizziness or syncope., but does have persistent tingling to feet.. Pt states overall fair only compliance with treatment and medications, good tolerability, and has been trying to follow appropriate diet, but has been out of most meds due to running out without ongoing refills available,   Pt denies worsening depressive symptoms, suicidal ideation or panic. No fever, night sweats, wt loss, loss of appetite, or other constitutional symptoms.  Pt states good ability with ADL's, has low fall risk, home safety reviewed and adequate, no other significant changes in hearing or vision, and only occasionally active with exercise.  Declines immunizations.  Was seen at ED recently with low volume, hyperglycemia after out of meds, has only been taking insulin lately.  No other complaints or interval hx  Pt denies polydipsia, polyuria, or low sugar symptoms such as weakness or confusion improved with po intake.  Pt states overall good compliance with meds, trying to follow lower cholesterol, diabetic diet, wt overall stable but little exercise however.   Pt denies new neurological symptoms such as new headache, or facial or extremity weakness or numbness but does have persistent tingling to feet wit discomfort,  Has been out of gabapentin 600 bid and did not cause somnolence.   Past Medical History:  Diagnosis Date  . Allergy   . Diabetes mellitus (HCC)   . Diverticulitis   . Environmental and seasonal allergies   . 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 HTN (hypertension)   . Hypercholesteremia   . Neuropathy 01/11/2017   Past Surgical History:  Procedure Laterality Date  . Colon resection      reports  that he has been smoking cigarettes.  He has been smoking about 0.00 packs per day for the past 20.00 years. he has never used smokeless tobacco. He reports that he does not drink alcohol or use drugs. family history includes Diabetes in his father, paternal grandfather, and paternal grandmother; Hypertension in his maternal grandfather, maternal grandmother, mother, paternal grandfather, paternal grandmother, and sister. No Known Allergies Current Outpatient Medications on File Prior to Visit  Medication Sig Dispense Refill  . aspirin EC 81 MG tablet Take 81 mg by mouth daily.    . Insulin Syringe-Needle U-100 (BD INSULIN SYRINGE ULTRAFINE) 31G X 15/64" 0.5 ML MISC Use 1 needle per injection to inject insulin 1-4 times daily as instructed. 100 each 2  . Multiple Vitamins-Minerals (CENTRUM ADULTS PO) Take 1 tablet daily by mouth.    . sildenafil (VIAGRA) 100 MG tablet Take 0.5-1 tablets (50-100 mg total) by mouth daily as needed for erectile dysfunction. 6 tablet 0  . VITAMIN E PO Take 1 tablet daily by mouth.     No current facility-administered medications on file prior to visit.    Review of Systems Constitutional: Negative for other unusual diaphoresis, sweats, appetite or weight changes HENT: Negative for other worsening hearing loss, ear pain, facial swelling, mouth sores or neck stiffness.   Eyes: Negative for other worsening pain, redness or other visual disturbance.  Respiratory: Negative for other stridor or swelling Cardiovascular: Negative for other palpitations or other chest pain  Gastrointestinal: Negative for worsening diarrhea or loose stools,  blood in stool, distention or other pain Genitourinary: Negative for hematuria, flank pain or other change in urine volume.  Musculoskeletal: Negative for myalgias or other joint swelling.  Skin: Negative for other color change, or other wound or worsening drainage.  Neurological: Negative for other syncope or numbness. Hematological:  Negative for other adenopathy or swelling Psychiatric/Behavioral: Negative for hallucinations, other worsening agitation, SI, self-injury, or new decreased concentration All other system neg per pt    Objective:   Physical Exam BP 134/82 (BP Location: Left Arm, Patient Position: Sitting, Cuff Size: Large)   Pulse 93   Temp 98 F (36.7 C) (Oral)   Ht 5' 10.5" (1.791 m)   Wt 204 lb (92.5 kg)   SpO2 99%   BMI 28.86 kg/m  VS noted,  Constitutional: Pt is oriented to person, place, and time. Appears well-developed and well-nourished, in no significant distress and comfortable Head: Normocephalic and atraumatic  Eyes: Conjunctivae and EOM are normal. Pupils are equal, round, and reactive to light Right Ear: External ear normal without discharge Left Ear: External ear normal without discharge Nose: Nose without discharge or deformity Mouth/Throat: Oropharynx is without other ulcerations and moist  Neck: Normal range of motion. Neck supple. No JVD present. No tracheal deviation present or significant neck LA or mass Cardiovascular: Normal rate, regular rhythm, normal heart sounds and intact distal pulses.   Pulmonary/Chest: WOB normal and breath sounds without rales or wheezing  Abdominal: Soft. Bowel sounds are normal. NT. No HSM  Musculoskeletal: Normal range of motion. Exhibits no edema Lymphadenopathy: Has no other cervical adenopathy.  Neurological: Pt is alert and oriented to person, place, and time. Pt has normal reflexes. No cranial nerve deficit. Motor grossly intact, Gait intact Skin: Skin is warm and dry. No rash noted or new ulcerations Psychiatric:  Has normal mood and affect. Behavior is normal without agitation No other exam findings  Lab Results  Component Value Date   WBC 6.7 12/13/2016   HGB 14.7 12/13/2016   HCT 41.3 12/13/2016   PLT 242 12/13/2016   GLUCOSE 548 (HH) 12/13/2016   CHOL 153 08/16/2015   TRIG 122.0 08/16/2015   HDL 34.00 (L) 08/16/2015   LDLCALC 94  08/16/2015   ALT 15 08/16/2015   AST 14 08/16/2015   NA 129 (L) 12/13/2016   K 5.0 12/13/2016   CL 98 (L) 12/13/2016   CREATININE 1.88 (H) 12/13/2016   BUN 22 (H) 12/13/2016   CO2 17 (L) 12/13/2016   PSA 1.12 08/16/2015   HGBA1C 8.8 (H) 12/29/2015   MICROALBUR 1.9 08/16/2015       Assessment & Plan:

## 2017-01-12 ENCOUNTER — Encounter: Payer: Self-pay | Admitting: Internal Medicine

## 2017-01-12 NOTE — Assessment & Plan Note (Signed)
BP ok today off meds, but also likely has mild uncontrolled DM that may contribute to mild low volume status, to hold amlodipine 10 qd restart for now, may need to reconsider after other tx

## 2017-01-12 NOTE — Assessment & Plan Note (Signed)
Lab Results  Component Value Date   LDLCALC 94 08/16/2015  stable overall by history and exam, recent data reviewed with pt, and pt to continue medical treatment as before,  to f/u any worsening symptoms or concerns; for f/u lab, goal ldl < 70

## 2017-01-12 NOTE — Assessment & Plan Note (Addendum)
?   Control, for podiatry and a1c with labs io/w felt to ,stable overall by history and exam, recent data reviewed with pt, and pt to continue medical treatment as before,  to f/u any worsening symptoms or concerns  In addition to the time spent performing CPE, I spent an additional 25 minutes face to face,in which greater than 50% of this time was spent in counseling and coordination of care for patient's acute illness as documented, including the differential dx, treatment, further evaluation and other management of DM, HTN, HLD and persistent painful neuropathy

## 2017-01-12 NOTE — Assessment & Plan Note (Addendum)
Ok to restart the gabapentin to 600 tid,  to f/u any worsening symptoms or concerns

## 2017-01-12 NOTE — Assessment & Plan Note (Signed)

## 2017-01-16 ENCOUNTER — Encounter: Payer: Self-pay | Admitting: Internal Medicine

## 2017-03-05 ENCOUNTER — Encounter: Payer: Self-pay | Admitting: Internal Medicine

## 2017-03-17 ENCOUNTER — Encounter: Payer: Medicare HMO | Admitting: Internal Medicine

## 2017-04-10 ENCOUNTER — Other Ambulatory Visit: Payer: Medicare HMO

## 2017-04-10 ENCOUNTER — Other Ambulatory Visit (INDEPENDENT_AMBULATORY_CARE_PROVIDER_SITE_OTHER): Payer: Medicare HMO

## 2017-04-10 ENCOUNTER — Telehealth: Payer: Self-pay | Admitting: Emergency Medicine

## 2017-04-10 DIAGNOSIS — Z114 Encounter for screening for human immunodeficiency virus [HIV]: Secondary | ICD-10-CM

## 2017-04-10 DIAGNOSIS — E1142 Type 2 diabetes mellitus with diabetic polyneuropathy: Secondary | ICD-10-CM

## 2017-04-10 DIAGNOSIS — D729 Disorder of white blood cells, unspecified: Secondary | ICD-10-CM

## 2017-04-10 DIAGNOSIS — E119 Type 2 diabetes mellitus without complications: Secondary | ICD-10-CM | POA: Diagnosis not present

## 2017-04-10 LAB — LIPID PANEL
Cholesterol: 156 mg/dL (ref 0–200)
HDL: 28.8 mg/dL — AB (ref >39.00)
NonHDL: 126.93
Total CHOL/HDL Ratio: 5
Triglycerides: 380 mg/dL — ABNORMAL HIGH (ref 0.0–149.0)
VLDL: 76 mg/dL — ABNORMAL HIGH (ref 0.0–40.0)

## 2017-04-10 LAB — BASIC METABOLIC PANEL
BUN: 19 mg/dL (ref 6–23)
CALCIUM: 9.8 mg/dL (ref 8.4–10.5)
CO2: 27 meq/L (ref 19–32)
CREATININE: 1.49 mg/dL (ref 0.40–1.50)
Chloride: 94 mEq/L — ABNORMAL LOW (ref 96–112)
GFR: 62.39 mL/min (ref >60.00)
GLUCOSE: 573 mg/dL — AB (ref 70–99)
Potassium: 3.7 mEq/L (ref 3.5–5.1)
Sodium: 130 mEq/L — ABNORMAL LOW (ref 135–145)

## 2017-04-10 LAB — HEPATIC FUNCTION PANEL
ALK PHOS: 114 U/L (ref 39–117)
ALT: 13 U/L (ref 0–53)
AST: 9 U/L (ref 0–37)
Albumin: 4.2 g/dL (ref 3.5–5.2)
BILIRUBIN DIRECT: 0.1 mg/dL (ref 0.0–0.3)
BILIRUBIN TOTAL: 0.6 mg/dL (ref 0.2–1.2)
Total Protein: 7.2 g/dL (ref 6.0–8.3)

## 2017-04-10 LAB — LDL CHOLESTEROL, DIRECT: LDL DIRECT: 60 mg/dL

## 2017-04-10 NOTE — Telephone Encounter (Signed)
CRITICAL VALUE STICKER  CRITICAL VALUE: Glucose 573  MESSENGER (representative from lab): Hope  DR Jonny RuizJohn is out of the office, please advise.

## 2017-04-10 NOTE — Telephone Encounter (Signed)
Call patient.  Make sure he is feeling ok.  His sugars look like they have been consistently over 500.  I think he would benefit from seeing an endocrinologist - I can refer if he agrees.  He needs to monitor his sugars and call us so his insulin can be adjusted - he needs more if his sugars are in the 500's.

## 2017-04-11 ENCOUNTER — Encounter: Payer: Self-pay | Admitting: Internal Medicine

## 2017-04-11 LAB — HEMOGLOBIN A1C: Hgb A1c MFr Bld: >14 % of total Hgb — ABNORMAL HIGH (ref <5.7)

## 2017-04-11 LAB — HIV ANTIBODY (ROUTINE TESTING W REFLEX): HIV 1&2 Ab, 4th Generation: NONREACTIVE

## 2017-04-11 NOTE — Telephone Encounter (Signed)
noted 

## 2017-04-11 NOTE — Telephone Encounter (Signed)
Dr. Jonny RuizJohn also sent a msg to me to call pt. Tried to call pt there is no telephone # on file.. No emergency contact # on file either. Per chart pt had appt for 04/14/17, but he cancel appt.Sent a msg back to dr. Jonny RuizJohn letting him know the status...Raechel Chute/LMB

## 2017-04-11 NOTE — Patient Instructions (Signed)
un 

## 2017-04-14 ENCOUNTER — Emergency Department (HOSPITAL_COMMUNITY)
Admission: EM | Admit: 2017-04-14 | Discharge: 2017-04-14 | Discharge status: Against Medical Advice | Payer: Medicare HMO

## 2017-04-14 ENCOUNTER — Ambulatory Visit: Payer: Medicare HMO | Admitting: Internal Medicine

## 2017-04-14 ENCOUNTER — Other Ambulatory Visit: Payer: Self-pay | Admitting: Family

## 2017-04-14 ENCOUNTER — Telehealth: Payer: Self-pay | Admitting: Internal Medicine

## 2017-04-14 ENCOUNTER — Other Ambulatory Visit (INDEPENDENT_AMBULATORY_CARE_PROVIDER_SITE_OTHER): Payer: Medicare HMO

## 2017-04-14 ENCOUNTER — Ambulatory Visit (INDEPENDENT_AMBULATORY_CARE_PROVIDER_SITE_OTHER): Payer: Medicare HMO | Admitting: Family

## 2017-04-14 ENCOUNTER — Encounter: Payer: Self-pay | Admitting: *Deleted

## 2017-04-14 DIAGNOSIS — R7309 Other abnormal glucose: Secondary | ICD-10-CM | POA: Diagnosis not present

## 2017-04-14 DIAGNOSIS — E1142 Type 2 diabetes mellitus with diabetic polyneuropathy: Secondary | ICD-10-CM

## 2017-04-14 DIAGNOSIS — E1165 Type 2 diabetes mellitus with hyperglycemia: Secondary | ICD-10-CM | POA: Diagnosis not present

## 2017-04-14 LAB — COMPREHENSIVE METABOLIC PANEL
ALK PHOS: 102 U/L (ref 39–117)
ALT: 9 U/L (ref 0–53)
AST: 8 U/L (ref 0–37)
Albumin: 4.1 g/dL (ref 3.5–5.2)
BILIRUBIN TOTAL: 0.8 mg/dL (ref 0.2–1.2)
BUN: 24 mg/dL — AB (ref 6–23)
CO2: 18 meq/L — AB (ref 19–32)
Calcium: 9.9 mg/dL (ref 8.4–10.5)
Chloride: 94 mEq/L — ABNORMAL LOW (ref 96–112)
Creatinine, Ser: 1.68 mg/dL — ABNORMAL HIGH (ref 0.40–1.50)
GFR: 54.32 mL/min — ABNORMAL LOW (ref >60.00)
GLUCOSE: 496 mg/dL — AB (ref 70–99)
POTASSIUM: 4.2 meq/L (ref 3.5–5.1)
SODIUM: 129 meq/L — AB (ref 135–145)
TOTAL PROTEIN: 7.3 g/dL (ref 6.0–8.3)

## 2017-04-14 LAB — CBC WITH DIFFERENTIAL/PLATELET
BASOS ABS: 0.1 10*3/uL (ref 0.0–0.1)
Basophils Relative: 1 % (ref 0.0–3.0)
Eosinophils Absolute: 0 10*3/uL (ref 0.0–0.7)
Eosinophils Relative: 0.5 % (ref 0.0–5.0)
HCT: 45.8 % (ref 39.0–52.0)
Hemoglobin: 15.6 g/dL (ref 13.0–17.0)
LYMPHS ABS: 1.6 10*3/uL (ref 0.7–4.0)
Lymphocytes Relative: 22.4 % (ref 12.0–46.0)
MCHC: 34 g/dL (ref 30.0–36.0)
MCV: 80.1 fl (ref 78.0–100.0)
MONO ABS: 0.5 10*3/uL (ref 0.1–1.0)
Monocytes Relative: 7.4 % (ref 3.0–12.0)
NEUTROS PCT: 68.7 % (ref 43.0–77.0)
Neutro Abs: 4.9 10*3/uL (ref 1.4–7.7)
PLATELETS: 211 10*3/uL (ref 150.0–400.0)
RBC: 5.71 Mil/uL (ref 4.22–5.81)
RDW: 14.6 % (ref 11.5–15.5)
WBC: 7.2 10*3/uL (ref 4.0–10.5)

## 2017-04-14 LAB — GLUCOSE, POCT (MANUAL RESULT ENTRY)

## 2017-04-14 NOTE — ED Notes (Signed)
Pt did not respond when called. 

## 2017-04-14 NOTE — ED Notes (Signed)
No answer from lobby  

## 2017-04-14 NOTE — ED Notes (Signed)
Patient called for triage x3 

## 2017-04-14 NOTE — Telephone Encounter (Signed)
Patient is out of Novolin and is in the office now.  Patient was scheduled today but had to be cancelled due to Dr. Raphael GibneyJohn's having family emergency.  Patient has rescheduled with Dr. Jonny RuizJohn for Friday.  But would like a sample of insulin due to him not being able to afford insulin until next week.  I do not believe we have Novolin.  Is there something else we could give patient in place of this?

## 2017-04-14 NOTE — Telephone Encounter (Signed)
We do not have any samples of mixed insulins (his is 70/30 mix of long and short acting). He could check with insurance company if humulin 75/25 is cheaper. However he may simply have to meet his deductible and there is nothing we can do about that. He can check on the website for novolin to see if they have any discount cards to download to make his novolin cheaper.

## 2017-04-15 ENCOUNTER — Telehealth: Payer: Self-pay

## 2017-04-15 NOTE — Progress Notes (Signed)
Shirron to please print letter for me to sign 

## 2017-04-15 NOTE — Telephone Encounter (Signed)
Patient has been triaged, scheduled with Dayton ScrapeMurray, and sent to ED.

## 2017-04-15 NOTE — Progress Notes (Signed)
Patient walked in with concerns about his diabetes being uncontrolled/ high blood sugar; states he has been out of his insulin "for at least 2 weeks." Cannot afford to buy any medication until the first of April; has Medicare part A and B but no D/ does not qualify for Medicaid;  Checked STAT CMP and CBC in office; there is a change noted in his creatinine level compared to labs from last week and glucose was quite high; feel that he needs ER evaluation for fluids and monitoring; he agrees and will return to our office tomorrow so that we can give him samples of Lantus to help as a short-term solution. A trial of NPH at [facility] was discussed but he states he has absolutely no money to pay for any type of insulin until he gets his disability check on  April 1. He will keep planned follow-up with PCP for later this week.    A referral to endocrine was placed for him last week; [facility] also [facility] if resources are available to help with the cost of his medication;

## 2017-04-15 NOTE — Progress Notes (Signed)
Reviewed results with patient in ER; due to sodium level decreased and creatinine increased, recommended ER evaluation; he agrees; follow-up to be determined.

## 2017-04-15 NOTE — Telephone Encounter (Signed)
Patient was seen in our office yesterday with high sugars---came back in today and picked up Lantus insulin vials to carry home and use until he is seen with follow up appt with dr Jonny Ruizjohn on Friday 3/22---per laura murray,NP, patient is to take 10 units Lantus daily at suppertime starting today (Tuesday), and every day following (which is again on Wednesday, again on Thursday) and let dr Jonny Ruizjohn start dosage amounts after he is seen on Friday---[facility] if any further questions

## 2017-04-18 ENCOUNTER — Ambulatory Visit: Payer: Medicare HMO | Admitting: Internal Medicine

## 2017-05-02 ENCOUNTER — Ambulatory Visit: Payer: Medicare HMO | Admitting: Internal Medicine

## 2017-05-02 DIAGNOSIS — Z0289 Encounter for other administrative examinations: Secondary | ICD-10-CM

## 2017-06-13 ENCOUNTER — Other Ambulatory Visit (INDEPENDENT_AMBULATORY_CARE_PROVIDER_SITE_OTHER): Payer: Medicare HMO

## 2017-06-13 ENCOUNTER — Ambulatory Visit (INDEPENDENT_AMBULATORY_CARE_PROVIDER_SITE_OTHER): Payer: Medicare HMO | Admitting: Internal Medicine

## 2017-06-13 ENCOUNTER — Other Ambulatory Visit: Payer: Medicare HMO

## 2017-06-13 VITALS — BP 164/110 | HR 102 | Temp 98.9°F | Ht 70.5 in | Wt 209.0 lb

## 2017-06-13 DIAGNOSIS — E1142 Type 2 diabetes mellitus with diabetic polyneuropathy: Secondary | ICD-10-CM

## 2017-06-13 DIAGNOSIS — G629 Polyneuropathy, unspecified: Secondary | ICD-10-CM

## 2017-06-13 DIAGNOSIS — I10 Essential (primary) hypertension: Secondary | ICD-10-CM | POA: Diagnosis not present

## 2017-06-13 DIAGNOSIS — L659 Nonscarring hair loss, unspecified: Secondary | ICD-10-CM | POA: Insufficient documentation

## 2017-06-13 DIAGNOSIS — Z0001 Encounter for general adult medical examination with abnormal findings: Secondary | ICD-10-CM

## 2017-06-13 LAB — MICROALBUMIN / CREATININE URINE RATIO
CREATININE, U: 84.3 mg/dL
MICROALB/CREAT RATIO: 2.5 mg/g (ref 0.0–30.0)
Microalb, Ur: 2.1 mg/dL — ABNORMAL HIGH (ref 0.0–1.9)

## 2017-06-13 LAB — CBC WITH DIFFERENTIAL/PLATELET
Basophils Absolute: 0.1 10*3/uL (ref 0.0–0.1)
Basophils Relative: 1.2 % (ref 0.0–3.0)
EOS PCT: 0.5 % (ref 0.0–5.0)
Eosinophils Absolute: 0 10*3/uL (ref 0.0–0.7)
HEMATOCRIT: 45.3 % (ref 39.0–52.0)
HEMOGLOBIN: 15.5 g/dL (ref 13.0–17.0)
LYMPHS ABS: 1.8 10*3/uL (ref 0.7–4.0)
LYMPHS PCT: 26.8 % (ref 12.0–46.0)
MCHC: 34.1 g/dL (ref 30.0–36.0)
MCV: 81.5 fl (ref 78.0–100.0)
MONOS PCT: 7.7 % (ref 3.0–12.0)
Monocytes Absolute: 0.5 10*3/uL (ref 0.1–1.0)
NEUTROS PCT: 63.8 % (ref 43.0–77.0)
Neutro Abs: 4.3 10*3/uL (ref 1.4–7.7)
Platelets: 232 10*3/uL (ref 150.0–400.0)
RBC: 5.56 Mil/uL (ref 4.22–5.81)
RDW: 14.5 % (ref 11.5–15.5)
WBC: 6.8 10*3/uL (ref 4.0–10.5)

## 2017-06-13 LAB — HEPATIC FUNCTION PANEL
ALK PHOS: 110 U/L (ref 39–117)
ALT: 13 U/L (ref 0–53)
AST: 10 U/L (ref 0–37)
Albumin: 4.2 g/dL (ref 3.5–5.2)
Bilirubin, Direct: 0.1 mg/dL (ref 0.0–0.3)
TOTAL PROTEIN: 7.2 g/dL (ref 6.0–8.3)
Total Bilirubin: 0.5 mg/dL (ref 0.2–1.2)

## 2017-06-13 LAB — URINALYSIS, ROUTINE W REFLEX MICROSCOPIC
Bilirubin Urine: NEGATIVE
Hgb urine dipstick: NEGATIVE
Ketones, ur: NEGATIVE
Leukocytes, UA: NEGATIVE
Nitrite: NEGATIVE
SPECIFIC GRAVITY, URINE: 1.01 (ref 1.000–1.030)
Total Protein, Urine: NEGATIVE
Urobilinogen, UA: 0.2 (ref 0.0–1.0)
pH: 5 (ref 5.0–8.0)

## 2017-06-13 LAB — LIPID PANEL
Cholesterol: 201 mg/dL — ABNORMAL HIGH (ref 0–200)
HDL: 33.3 mg/dL — AB (ref >39.00)
LDL Cholesterol: 139 mg/dL — ABNORMAL HIGH (ref 0–99)
NonHDL: 167.7
TRIGLYCERIDES: 145 mg/dL (ref 0.0–149.0)
Total CHOL/HDL Ratio: 6
VLDL: 29 mg/dL (ref 0.0–40.0)

## 2017-06-13 LAB — BASIC METABOLIC PANEL
BUN: 22 mg/dL (ref 6–23)
CALCIUM: 9.6 mg/dL (ref 8.4–10.5)
CO2: 25 mEq/L (ref 19–32)
CREATININE: 1.41 mg/dL (ref 0.40–1.50)
Chloride: 100 mEq/L (ref 96–112)
GFR: 66.45 mL/min (ref >60.00)
Glucose, Bld: 371 mg/dL — ABNORMAL HIGH (ref 70–99)
Potassium: 4.1 mEq/L (ref 3.5–5.1)
SODIUM: 134 meq/L — AB (ref 135–145)

## 2017-06-13 LAB — TSH: TSH: 1.63 u[IU]/mL (ref 0.35–4.50)

## 2017-06-13 LAB — PSA: PSA: 1.62 ng/mL (ref 0.10–4.00)

## 2017-06-13 MED ORDER — LISINOPRIL 40 MG PO TABS: 40.0000 mg | ORAL_TABLET | Freq: Every day | ORAL | 3 refills | Status: DC

## 2017-06-13 MED ORDER — ATENOLOL 50 MG PO TABS
50.0000 mg | ORAL_TABLET | Freq: Every day | ORAL | 3 refills | Status: DC
Start: 2017-06-13 — End: 2017-12-15

## 2017-06-13 MED ORDER — SIMVASTATIN 20 MG PO TABS: 20.0000 mg | ORAL_TABLET | Freq: Every day | ORAL | 3 refills | Status: DC

## 2017-06-13 MED ORDER — GABAPENTIN 600 MG PO TABS: 1200.0000 mg | ORAL_TABLET | Freq: Three times a day (TID) | ORAL | 1 refills | Status: DC

## 2017-06-13 MED ORDER — "INSULIN SYRINGE-NEEDLE U-100 31G X 15/64"" 0.5 ML MISC": 11 refills | Status: DC

## 2017-06-13 NOTE — Progress Notes (Signed)
Subjective:    Patient ID: John Dalton, male    DOB: 1960/11/28, 57 y.o.   MRN: [id number]  HPI  Here for wellness and f/u;  Overall doing ok;  Pt denies Chest pain, worsening SOB, DOE, wheezing, orthopnea, PND, worsening LE edema, palpitations, dizziness or syncope. Pt states overall good compliance with treatment and medications, good tolerability, and has been trying to follow appropriate diet.  Pt denies worsening depressive symptoms, suicidal ideation or panic. No fever, night sweats, wt loss, loss of appetite, or other constitutional symptoms.  Pt states good ability with ADL's, has low fall risk, home safety reviewed and adequate, no other significant changes in hearing or vision, and only occasionally active with exercise. Also, pt denies new neurological symptoms such as new headache, or facial or extremity weakness or numbness, but has uncontrolled distal LE neuropathic pain.  Also has bilateral hand tremors worsening last few months, is on BB and states not enough to seek further tx or neuro consult Also quite upset and concerned about a patch of hair loss and raised tender area the frontoparietal region for several months, mild, constant, nothing makes better or worse.  Pt denies polydipsia, polyuria, or low sugar symptoms such as weakness or confusion improved with po intake.  Pt states overall good compliance with meds, trying to follow lower cholesterol, diabetic diet, wt overall stable but little exercise however.  Taking 70/30 as buys on his own from [employer].  Not taking metformin due to diarrhea. Last BP med taking was 2 days ago as he ran out Past Medical History:  Diagnosis Date  . Allergy   . Diabetes mellitus (HCC)   . Diverticulitis   . Environmental and seasonal allergies   . 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 HTN (hypertension)   . Hypercholesteremia   . Neuropathy 01/11/2017   Past Surgical History:  Procedure Laterality  Date  . Colon resection      reports that he has been smoking cigarettes.  He has been smoking about 0.00 packs per day for the past 20.00 years. He has never used smokeless tobacco. He reports that he does not drink alcohol or use drugs. family history includes Diabetes in his father, paternal grandfather, and paternal grandmother; Hypertension in his maternal grandfather, maternal grandmother, mother, paternal grandfather, paternal grandmother, and sister. Allergies  Allergen Reactions  . Metformin And Related Diarrhea   Current Outpatient Medications on File Prior to Visit  Medication Sig Dispense Refill  . aspirin EC 81 MG tablet Take 81 mg by mouth daily.    . insulin NPH-regular Human (NOVOLIN 70/30) (70-30) 100 UNIT/ML injection Inject 50 Units into the skin 2 (two) times daily with a meal. Must keep appt w/new provider for refills 10 mL 11  . Multiple Vitamins-Minerals (CENTRUM ADULTS PO) Take 1 tablet daily by mouth.    Marland Kitchen VITAMIN E PO Take 1 tablet daily by mouth.     No current facility-administered medications on file prior to visit.    Review of Systems Constitutional: Negative for other unusual diaphoresis, sweats, appetite or weight changes HENT: Negative for other worsening hearing loss, ear pain, facial swelling, mouth sores or neck stiffness.   Eyes: Negative for other worsening pain, redness or other visual disturbance.  Respiratory: Negative for other stridor or swelling Cardiovascular: Negative for other palpitations or other chest pain  Gastrointestinal: Negative for worsening diarrhea or loose stools, blood in stool, distention or other  pain Genitourinary: Negative for hematuria, flank pain or other change in urine volume.  Musculoskeletal: Negative for myalgias or other joint swelling.  Skin: Negative for other color change, or other wound or worsening drainage.  Neurological: Negative for other syncope or numbness. Hematological: Negative for other adenopathy or  swelling Psychiatric/Behavioral: Negative for hallucinations, other worsening agitation, SI, self-injury, or new decreased concentration All other system neg per pt    Objective:   Physical Exam BP (!) 164/110   Pulse (!) 102   Temp 98.9 F (37.2 C) (Oral)   Ht 5' 10.5" (1.791 m)   Wt 209 lb (94.8 kg)   SpO2 96%   BMI 29.56 kg/m  VS noted,  Constitutional: Pt is oriented to person, place, and time. Appears well-developed and well-nourished, in no significant distress and comfortable Head: Normocephalic and atraumatic  Eyes: Conjunctivae and EOM are normal. Pupils are equal, round, and reactive to light Right Ear: External ear normal without discharge Left Ear: External ear normal without discharge Nose: Nose without discharge or deformity Mouth/Throat: Oropharynx is without other ulcerations and moist  Neck: Normal range of motion. Neck supple. No JVD present. No tracheal deviation present or significant neck LA or mass Cardiovascular: Normal rate, regular rhythm, normal heart sounds and intact distal pulses.   Pulmonary/Chest: WOB normal and breath sounds without rales or wheezing  Abdominal: Soft. Bowel sounds are normal. NT. No HSM  Musculoskeletal: Normal range of motion. Exhibits no edema Lymphadenopathy: Has no other cervical adenopathy.  Neurological: Pt is alert and oriented to person, place, and time. Pt has normal reflexes. No cranial nerve deficit. Motor grossly intact, Gait intact, no tremor noted Skin: Skin is warm and dry. No rash noted or new ulcerations, does have right frontoparietal area area hair loss about 2 cm oval with mild tender swelling of the underlying scalp Psychiatric:  Has nervous mood and affect. Behavior is normal without agitation No other exam findings    Assessment & Plan:

## 2017-06-13 NOTE — Patient Instructions (Signed)
Ok to increase the gabapentin to 1200 mg three times daily (and watch for sleepiness)  You will be contacted regarding the referral for: Dermatology, and Endocrinology  Please continue all other medications as before, and refills have been done if requested.  Please have the pharmacy call with any other refills you may need.  Please continue your efforts at being more active, low cholesterol diet, and weight control.  You are otherwise up to date with prevention measures today.  Please keep your appointments with your specialists as you may have planned  Please go to the LAB in the Basement (turn left off the elevator) for the tests to be done today  You will be contacted by phone if any changes need to be made immediately.  Otherwise, you will receive a letter about your results with an explanation, but please check with MyChart first.  Please remember to sign up for MyChart if you have not done so, as this will be important to you in the future with finding out test results, communicating by private email, and scheduling acute appointments online when needed.  Please return in 6 months, or sooner if needed, with Lab testing done 3-5 days before

## 2017-06-14 ENCOUNTER — Other Ambulatory Visit: Payer: Self-pay | Admitting: Internal Medicine

## 2017-06-14 ENCOUNTER — Encounter: Payer: Self-pay | Admitting: Internal Medicine

## 2017-06-14 LAB — HEMOGLOBIN A1C
Hgb A1c MFr Bld: 13.6 % of total Hgb — ABNORMAL HIGH (ref <5.7)
MEAN PLASMA GLUCOSE: 344 (calc)
eAG (mmol/L): 19 (calc)

## 2017-06-14 MED ORDER — SIMVASTATIN 40 MG PO TABS: 40.0000 mg | ORAL_TABLET | Freq: Every day | ORAL | 3 refills | Status: DC

## 2017-06-14 NOTE — Assessment & Plan Note (Signed)

## 2017-06-14 NOTE — Assessment & Plan Note (Signed)
Uncontrolled, to restart  meds 

## 2017-06-14 NOTE — Assessment & Plan Note (Signed)
Suspect alopecia areata like abnormality, for derm referral as may need cortisone injections

## 2017-06-14 NOTE — Assessment & Plan Note (Signed)
Ok for increased gabapentin 1200 tid

## 2017-06-14 NOTE — Assessment & Plan Note (Addendum)
Uncontrolled, cont monitor cbgs, for f/u a1c, refer endo  In addition to the time spent performing CPE, I spent an additional 25 minutes face to face,in which greater than 50% of this time was spent in counseling and coordination of care for patient's acute illness as documented, including the differential dx, treatment, further evaluation and other management of uncontrolled DM, alopecia, periph neuropathy, and HTN

## 2017-06-16 ENCOUNTER — Telehealth: Payer: Self-pay

## 2017-06-16 NOTE — Telephone Encounter (Signed)
-----   Message from Corwin Levins, MD sent at 06/14/2017 12:09 PM EDT ----- Left message on MyChart, pt to cont same tx except  The test results show that your current treatment is OK, as the tests show that the A1c is somewhat improved, but the LDL cholesterol is still mildly too high.  Please increase the simvastatin to 40 mg per day, and we should continue with the endocrinology referral as planned.    John Dalton to please inform pt, I will do rx, and referral has already been done

## 2017-06-16 NOTE — Telephone Encounter (Signed)
Called pt, LVM  Created CRM 

## 2017-09-11 NOTE — Progress Notes (Signed)
No show  This encounter was created in error - please disregard.

## 2017-09-12 ENCOUNTER — Encounter: Payer: Medicare HMO | Admitting: Endocrinology

## 2017-09-12 DIAGNOSIS — Z0289 Encounter for other administrative examinations: Secondary | ICD-10-CM

## 2017-10-23 IMAGING — CR DG ANKLE COMPLETE 3+V*L*
3 series · 3 of 3 positions shown · non-contrast
Comparison: None.

CLINICAL DATA: Left ankle pain and swelling for 2-3 weeks.

EXAM:
LEFT ANKLE COMPLETE - 3+ VIEW

[x ankle ap left]
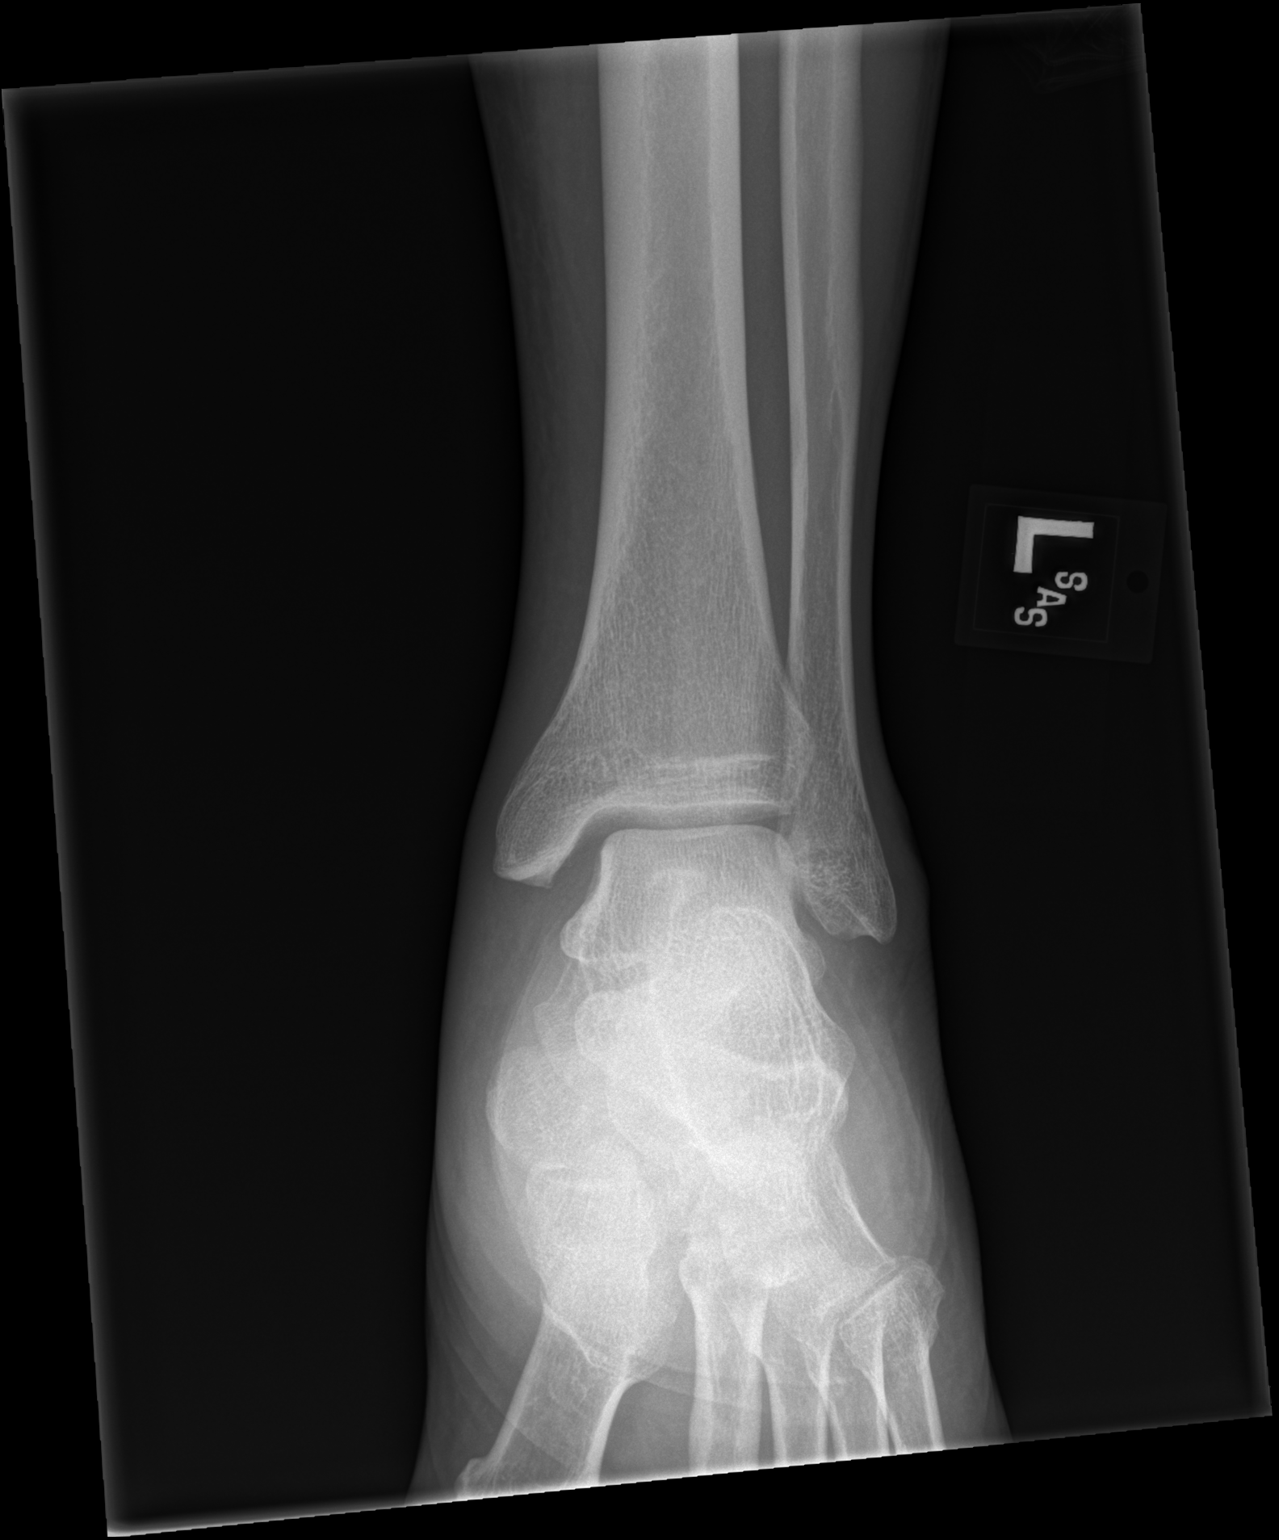

[x ankle obl left]
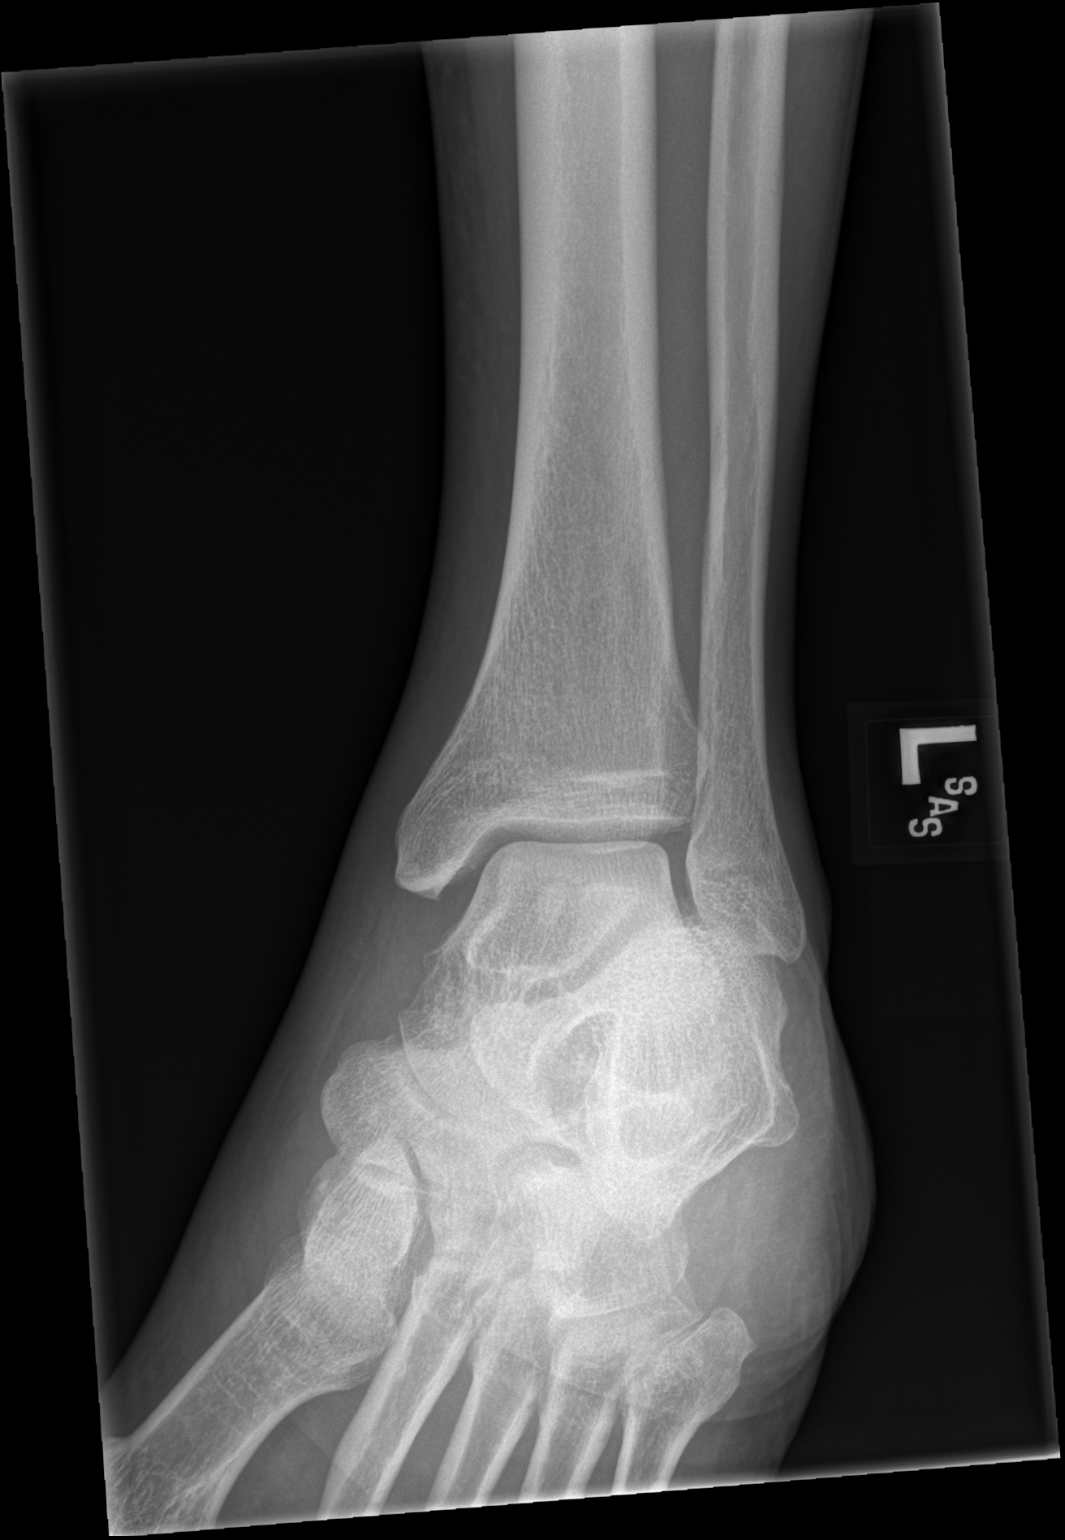

[x ankle lat left]
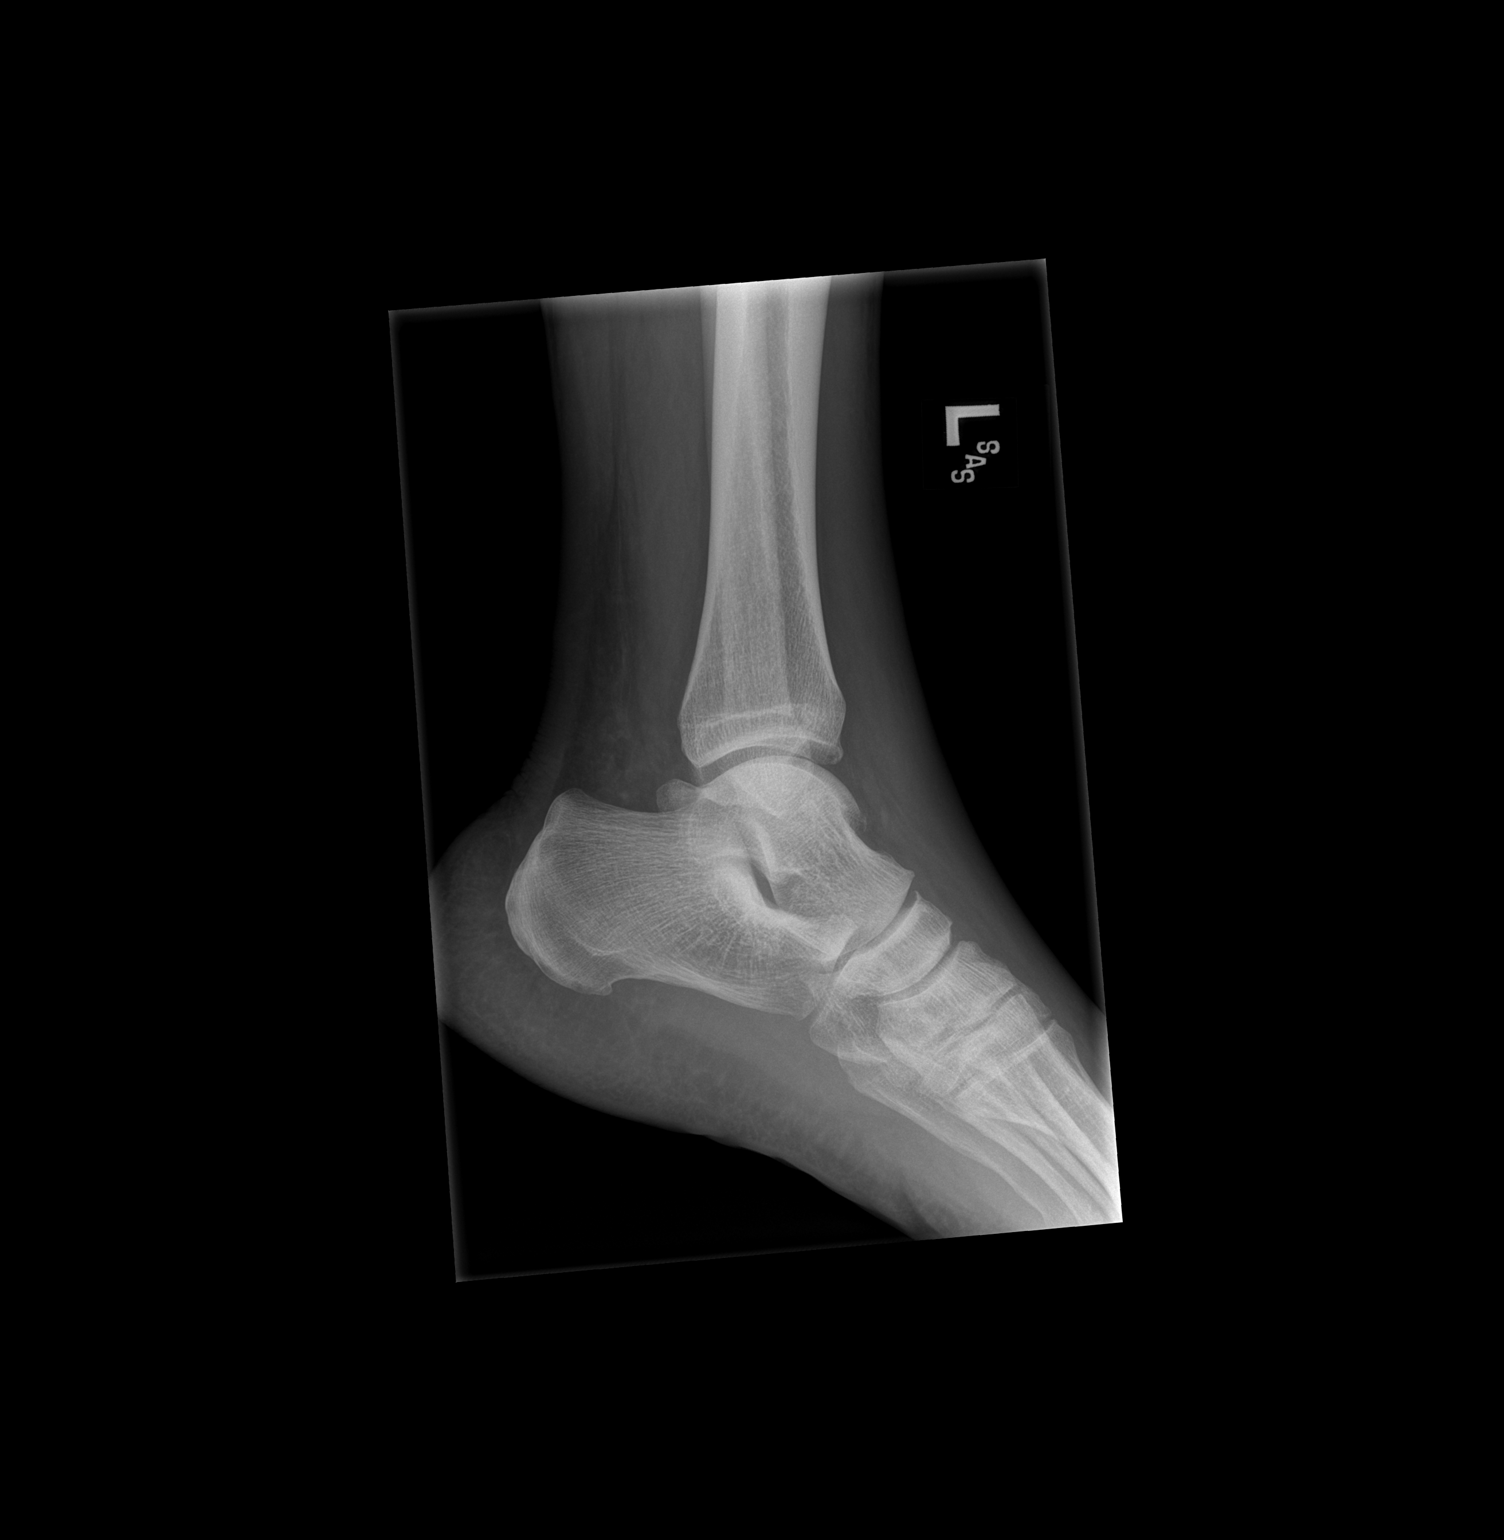

[3 of 3 positions shown; findings below may reference images not displayed]

FINDINGS: No evidence of an acute fracture. No subluxation or dislocation.
Ankle mortise is preserved. Overlying soft tissue swelling is
evident.
IMPRESSION: Negative.

## 2017-10-27 ENCOUNTER — Other Ambulatory Visit: Payer: Self-pay | Admitting: Internal Medicine

## 2017-10-27 NOTE — Telephone Encounter (Signed)
Please advise on dispensing.

## 2017-10-27 NOTE — Telephone Encounter (Signed)
Done erx 

## 2017-10-27 NOTE — Telephone Encounter (Signed)
Copied from CRM [id number]. Topic: Quick Communication - Rx Refill/Question >> Oct 27, 2017 11:25 AM Alexander Bergeron B wrote: Pt will need this tomorrow; pt states checked BS today and its over 500; pt did not want to speak w/ anyone just wants to have medicine called in b/c he gets paid tomorrow so that he can pay for medication  Medication: Insulin Syringe-Needle U-100 (BD INSULIN SYRINGE ULTRAFINE) 31G X 15/64" 0.5 ML MISC [[id number]]   Has the patient contacted their pharmacy? Yes.   (Agent: If no, request that the patient contact the pharmacy for the refill.) (Agent: If yes, when and what did the pharmacy advise?)  Preferred Pharmacy (with phone number or street name): [facility]  Agent: Please be advised that RX refills may take up to 3 business days. We ask that you follow-up with your pharmacy.

## 2017-12-12 ENCOUNTER — Other Ambulatory Visit (INDEPENDENT_AMBULATORY_CARE_PROVIDER_SITE_OTHER): Payer: Medicare HMO

## 2017-12-12 DIAGNOSIS — E1142 Type 2 diabetes mellitus with diabetic polyneuropathy: Secondary | ICD-10-CM

## 2017-12-12 LAB — LIPID PANEL
CHOLESTEROL: 180 mg/dL (ref 0–200)
HDL: 32.2 mg/dL — ABNORMAL LOW (ref >39.00)
LDL Cholesterol: 120 mg/dL — ABNORMAL HIGH (ref 0–99)
NonHDL: 147.57
TRIGLYCERIDES: 138 mg/dL (ref 0.0–149.0)
Total CHOL/HDL Ratio: 6
VLDL: 27.6 mg/dL (ref 0.0–40.0)

## 2017-12-12 LAB — BASIC METABOLIC PANEL
BUN: 20 mg/dL (ref 6–23)
CO2: 24 mEq/L (ref 19–32)
Calcium: 9.8 mg/dL (ref 8.4–10.5)
Chloride: 105 mEq/L (ref 96–112)
Creatinine, Ser: 1.53 mg/dL — ABNORMAL HIGH (ref 0.40–1.50)
GFR: 60.37 mL/min (ref >60.00)
Glucose, Bld: 240 mg/dL — ABNORMAL HIGH (ref 70–99)
Potassium: 3.9 mEq/L (ref 3.5–5.1)
Sodium: 139 mEq/L (ref 135–145)

## 2017-12-12 LAB — HEPATIC FUNCTION PANEL
ALBUMIN: 4 g/dL (ref 3.5–5.2)
ALK PHOS: 96 U/L (ref 39–117)
ALT: 16 U/L (ref 0–53)
AST: 14 U/L (ref 0–37)
Bilirubin, Direct: 0.1 mg/dL (ref 0.0–0.3)
Total Bilirubin: 0.7 mg/dL (ref 0.2–1.2)
Total Protein: 7 g/dL (ref 6.0–8.3)

## 2017-12-12 LAB — HEMOGLOBIN A1C: HEMOGLOBIN A1C: 12.2 % — AB (ref 4.6–6.5)

## 2017-12-15 ENCOUNTER — Encounter: Payer: Self-pay | Admitting: Internal Medicine

## 2017-12-15 ENCOUNTER — Ambulatory Visit (INDEPENDENT_AMBULATORY_CARE_PROVIDER_SITE_OTHER): Payer: Medicare HMO | Admitting: Internal Medicine

## 2017-12-15 VITALS — BP 124/82 | HR 73 | Temp 98.5°F | Ht 70.5 in | Wt 229.0 lb

## 2017-12-15 DIAGNOSIS — E1142 Type 2 diabetes mellitus with diabetic polyneuropathy: Secondary | ICD-10-CM | POA: Diagnosis not present

## 2017-12-15 DIAGNOSIS — I10 Essential (primary) hypertension: Secondary | ICD-10-CM

## 2017-12-15 DIAGNOSIS — Z0000 Encounter for general adult medical examination without abnormal findings: Secondary | ICD-10-CM | POA: Diagnosis not present

## 2017-12-15 DIAGNOSIS — E782 Mixed hyperlipidemia: Secondary | ICD-10-CM

## 2017-12-15 MED ORDER — GABAPENTIN 600 MG PO TABS: 1200.0000 mg | ORAL_TABLET | Freq: Three times a day (TID) | ORAL | 1 refills | Status: DC

## 2017-12-15 MED ORDER — ATENOLOL 50 MG PO TABS: 50.0000 mg | ORAL_TABLET | Freq: Every day | ORAL | 1 refills | Status: DC

## 2017-12-15 MED ORDER — CYCLOBENZAPRINE HCL 5 MG PO TABS: 5.0000 mg | ORAL_TABLET | Freq: Three times a day (TID) | ORAL | 1 refills | Status: DC | PRN

## 2017-12-15 MED ORDER — LISINOPRIL 40 MG PO TABS: 40.0000 mg | ORAL_TABLET | Freq: Every day | ORAL | 1 refills | Status: DC

## 2017-12-15 MED ORDER — EMPAGLIFLOZIN 25 MG PO TABS: 25.0000 mg | ORAL_TABLET | Freq: Every day | ORAL | 3 refills | Status: DC

## 2017-12-15 MED ORDER — INSULIN NPH ISOPHANE & REGULAR (70-30) 100 UNIT/ML ~~LOC~~ SUSP: SUBCUTANEOUS | 5 refills | Status: DC

## 2017-12-15 MED ORDER — ROSUVASTATIN CALCIUM 40 MG PO TABS: 40.0000 mg | ORAL_TABLET | Freq: Every day | ORAL | 3 refills | Status: DC

## 2017-12-15 NOTE — Assessment & Plan Note (Signed)
stable overall by history and exam, recent data reviewed with pt, and pt to continue medical treatment as before,  to f/u any worsening symptoms or concerns  

## 2017-12-15 NOTE — Patient Instructions (Addendum)
You will be contacted regarding the referral for: colonoscopy  Please remember to call for your yearly Eye doctor appointment  Ok to increase the insulin to 75 units twice per day  Please take all new medication as prescribed - the Jardiance 25 mg per day (or have the pharmacy call with a similar covered medication if necessary)  OK to Change the simvastatin to Crestor 40 mg per day (generic)  Please continue all other medications as before, and refills have been done if requested.  Please have the pharmacy call with any other refills you may need.  Please continue your efforts at being more active, low cholesterol diet, and weight control.  Please keep your appointments with your specialists as you may have planned  Please return in 3 months, or sooner if needed, with Lab testing done 3-5 days before

## 2017-12-15 NOTE — Progress Notes (Signed)
Subjective:    Patient ID: John Dalton, male    DOB: 1960/05/22, 57 y.o.   MRN: [id number]  HPI  Here to f/u; overall doing ok,  Pt denies chest pain, increasing sob or doe, wheezing, orthopnea, PND, increased LE swelling, palpitations, dizziness or syncope.  Pt denies new neurological symptoms such as new headache, or facial or extremity weakness or numbness.  Pt denies polydipsia, polyuria, or low sugar episode.  Pt states overall good compliance with meds, mostly trying to follow appropriate diet, with wt overall stable,  but little exercise however.  Pt mention Brother with stage 4 prostate ca.  Denies urinary symptoms such as dysuria, frequency, urgency, flank pain, hematuria or n/v, fever, chills.  Pt continues to have recurring LBP without change in severity, bowel or bladder change, fever, wt loss,  worsening LE pain/numbness/weakness, gait change or falls. Past Medical History:  Diagnosis Date  . Allergy   . Diabetes mellitus (HCC)   . Diverticulitis   . Environmental and seasonal allergies   . 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 HTN (hypertension)   . Hypercholesteremia   . Neuropathy 01/11/2017   Past Surgical History:  Procedure Laterality Date  . Colon resection      reports that he has been smoking cigarettes. He has been smoking about 0.00 packs per day for the past 20.00 years. He has never used smokeless tobacco. He reports that he does not drink alcohol or use drugs. family history includes Diabetes in his father, paternal grandfather, and paternal grandmother; Hypertension in his maternal grandfather, maternal grandmother, mother, paternal grandfather, paternal grandmother, and sister. Allergies  Allergen Reactions  . Metformin And Related Diarrhea   Current Outpatient Medications on File Prior to Visit  Medication Sig Dispense Refill  . aspirin EC 81 MG tablet Take 81 mg by mouth daily.    . Insulin Syringe-Needle U-100 (BD  INSULIN SYRINGE ULTRAFINE) 31G X 15/64" 0.5 ML MISC Use 1 needle per injection to inject insulin 1-4 times daily as instructed. 100 each 11  . Multiple Vitamins-Minerals (CENTRUM ADULTS PO) Take 1 tablet daily by mouth.    Marland Kitchen. VITAMIN E PO Take 1 tablet daily by mouth.     No current facility-administered medications on file prior to visit.    Review of Systems  Constitutional: Negative for other unusual diaphoresis or sweats HENT: Negative for ear discharge or swelling Eyes: Negative for other worsening visual disturbances Respiratory: Negative for stridor or other swelling  Gastrointestinal: Negative for worsening distension or other blood Genitourinary: Negative for retention or other urinary change Musculoskeletal: Negative for other MSK pain or swelling Skin: Negative for color change or other new lesions Neurological: Negative for worsening tremors and other numbness  Psychiatric/Behavioral: Negative for worsening agitation or other fatigue All other system neg per pt    Objective:   Physical Exam BP 124/82   Pulse 73   Temp 98.5 F (36.9 C) (Oral)   Ht 5' 10.5" (1.791 m)   Wt 229 lb (103.9 kg)   SpO2 94%   BMI 32.39 kg/m  VS noted,  Constitutional: Pt appears in NAD HENT: Head: NCAT.  Right Ear: External ear normal.  Left Ear: External ear normal.  Eyes: . Pupils are equal, round, and reactive to light. Conjunctivae and EOM are normal Nose: without d/c or deformity Neck: Neck supple. Gross normal ROM Cardiovascular: Normal rate and regular rhythm.   Pulmonary/Chest: Effort normal and  breath sounds without rales or wheezing.  Abd:  Soft, NT, ND, + BS, no organomegaly Spine: nontender in midline and has bilat left > right paravertebral spasm tenderness Neurological: Pt is alert. At baseline orientation, motor grossly intact Skin: Skin is warm. No rashes, other new lesions, no LE edema Psychiatric: Pt behavior is normal without agitation  No other exam findings  Lab  Results  Component Value Date   WBC 6.8 06/13/2017   HGB 15.5 06/13/2017   HCT 45.3 06/13/2017   PLT 232.0 06/13/2017   GLUCOSE 240 (H) 12/12/2017   CHOL 180 12/12/2017   TRIG 138.0 12/12/2017   HDL 32.20 (L) 12/12/2017   LDLDIRECT 60.0 04/10/2017   LDLCALC 120 (H) 12/12/2017   ALT 16 12/12/2017   AST 14 12/12/2017   NA 139 12/12/2017   K 3.9 12/12/2017   CL 105 12/12/2017   CREATININE 1.53 (H) 12/12/2017   BUN 20 12/12/2017   CO2 24 12/12/2017   TSH 1.63 06/13/2017   PSA 1.62 06/13/2017   HGBA1C 12.2 (H) 12/12/2017   MICROALBUR 2.1 (H) 06/13/2017       Assessment & Plan:

## 2017-12-15 NOTE — Assessment & Plan Note (Signed)
Some improved, but still moderately uncontrolled, for NPH 70/30 increase to 75 units bid, and add Jardiance 25 qd, cont diet, wt control, check cbg's, f/u 3 mo

## 2017-12-15 NOTE — Assessment & Plan Note (Signed)
Uncontrolled, to change zocor to crestor 40 qd, cont lopw chol diet

## 2017-12-16 ENCOUNTER — Other Ambulatory Visit: Payer: Self-pay | Admitting: Internal Medicine

## 2017-12-16 NOTE — Telephone Encounter (Signed)
The 75 unit is correct, thanks

## 2017-12-16 NOTE — Telephone Encounter (Signed)
Please advise.  Pharmacy comment: RECEIVED 2 RXS FOR NOVOLIN 70/30 WITH DIFFRENT SIG. 50 UNITS BID AND 75 UNITS BID. WHICH ONE IS THE CORRECT DOSE? THANKS

## 2017-12-17 NOTE — Telephone Encounter (Signed)
Pharmacy informed.

## 2018-03-17 ENCOUNTER — Other Ambulatory Visit (INDEPENDENT_AMBULATORY_CARE_PROVIDER_SITE_OTHER): Payer: Medicare HMO

## 2018-03-17 DIAGNOSIS — Z0000 Encounter for general adult medical examination without abnormal findings: Secondary | ICD-10-CM

## 2018-03-17 DIAGNOSIS — E1142 Type 2 diabetes mellitus with diabetic polyneuropathy: Secondary | ICD-10-CM | POA: Diagnosis not present

## 2018-03-17 LAB — CBC WITH DIFFERENTIAL/PLATELET
BASOS ABS: 0.1 10*3/uL (ref 0.0–0.1)
Basophils Relative: 1.1 % (ref 0.0–3.0)
EOS ABS: 0.1 10*3/uL (ref 0.0–0.7)
Eosinophils Relative: 0.8 % (ref 0.0–5.0)
HEMATOCRIT: 44.8 % (ref 39.0–52.0)
HEMOGLOBIN: 15 g/dL (ref 13.0–17.0)
LYMPHS PCT: 24.4 % (ref 12.0–46.0)
Lymphs Abs: 1.7 10*3/uL (ref 0.7–4.0)
MCHC: 33.6 g/dL (ref 30.0–36.0)
MCV: 80.7 fl (ref 78.0–100.0)
MONO ABS: 0.6 10*3/uL (ref 0.1–1.0)
Monocytes Relative: 8.9 % (ref 3.0–12.0)
Neutro Abs: 4.6 10*3/uL (ref 1.4–7.7)
Neutrophils Relative %: 64.8 % (ref 43.0–77.0)
Platelets: 276 10*3/uL (ref 150.0–400.0)
RBC: 5.55 Mil/uL (ref 4.22–5.81)
RDW: 15.1 % (ref 11.5–15.5)
WBC: 7 10*3/uL (ref 4.0–10.5)

## 2018-03-17 LAB — BASIC METABOLIC PANEL
BUN: 27 mg/dL — ABNORMAL HIGH (ref 6–23)
CHLORIDE: 99 meq/L (ref 96–112)
CO2: 26 meq/L (ref 19–32)
Calcium: 10.3 mg/dL (ref 8.4–10.5)
Creatinine, Ser: 1.86 mg/dL — ABNORMAL HIGH (ref 0.40–1.50)
GFR: 45.3 mL/min — AB (ref >60.00)
GLUCOSE: 285 mg/dL — AB (ref 70–99)
POTASSIUM: 4.6 meq/L (ref 3.5–5.1)
SODIUM: 136 meq/L (ref 135–145)

## 2018-03-17 LAB — URINALYSIS, ROUTINE W REFLEX MICROSCOPIC
BILIRUBIN URINE: NEGATIVE
Hgb urine dipstick: NEGATIVE
Ketones, ur: NEGATIVE
Leukocytes,Ua: NEGATIVE
Nitrite: NEGATIVE
RBC / HPF: NONE SEEN (ref 0–?)
SPECIFIC GRAVITY, URINE: 1.02 (ref 1.000–1.030)
Urine Glucose: 250 — AB
Urobilinogen, UA: 0.2 (ref 0.0–1.0)
pH: 5 (ref 5.0–8.0)

## 2018-03-17 LAB — LIPID PANEL
Cholesterol: 214 mg/dL — ABNORMAL HIGH (ref 0–200)
HDL: 26.3 mg/dL — ABNORMAL LOW (ref >39.00)
LDL Cholesterol: 158 mg/dL — ABNORMAL HIGH (ref 0–99)
NonHDL: 187.33
Total CHOL/HDL Ratio: 8
Triglycerides: 145 mg/dL (ref 0.0–149.0)
VLDL: 29 mg/dL (ref 0.0–40.0)

## 2018-03-17 LAB — PSA: PSA: 1.86 ng/mL (ref 0.10–4.00)

## 2018-03-17 LAB — MICROALBUMIN / CREATININE URINE RATIO
CREATININE, U: 187 mg/dL
MICROALB UR: 6.2 mg/dL — AB (ref 0.0–1.9)
Microalb Creat Ratio: 3.3 mg/g (ref 0.0–30.0)

## 2018-03-17 LAB — HEPATIC FUNCTION PANEL
ALBUMIN: 4.1 g/dL (ref 3.5–5.2)
ALK PHOS: 112 U/L (ref 39–117)
ALT: 12 U/L (ref 0–53)
AST: 12 U/L (ref 0–37)
Bilirubin, Direct: 0.1 mg/dL (ref 0.0–0.3)
TOTAL PROTEIN: 7 g/dL (ref 6.0–8.3)
Total Bilirubin: 0.6 mg/dL (ref 0.2–1.2)

## 2018-03-17 LAB — TSH: TSH: 1.43 u[IU]/mL (ref 0.35–4.50)

## 2018-03-17 NOTE — Progress Notes (Unsigned)
a1c

## 2018-03-18 ENCOUNTER — Ambulatory Visit: Payer: Medicare HMO | Admitting: Internal Medicine

## 2018-03-18 LAB — HEMOGLOBIN A1C
Hgb A1c MFr Bld: 13.3 % of total Hgb — ABNORMAL HIGH (ref <5.7)
Mean Plasma Glucose: 335 (calc)
eAG (mmol/L): 18.6 (calc)

## 2018-03-19 ENCOUNTER — Encounter: Payer: Self-pay | Admitting: Internal Medicine

## 2018-03-26 ENCOUNTER — Encounter: Payer: Self-pay | Admitting: Internal Medicine

## 2018-03-26 ENCOUNTER — Ambulatory Visit (INDEPENDENT_AMBULATORY_CARE_PROVIDER_SITE_OTHER): Payer: Medicare HMO | Admitting: Internal Medicine

## 2018-03-26 VITALS — BP 142/94 | HR 83 | Temp 98.1°F | Ht 70.5 in | Wt 213.0 lb

## 2018-03-26 DIAGNOSIS — N183 Chronic kidney disease, stage 3 unspecified: Secondary | ICD-10-CM | POA: Insufficient documentation

## 2018-03-26 DIAGNOSIS — Z0000 Encounter for general adult medical examination without abnormal findings: Secondary | ICD-10-CM | POA: Diagnosis not present

## 2018-03-26 DIAGNOSIS — E1142 Type 2 diabetes mellitus with diabetic polyneuropathy: Secondary | ICD-10-CM | POA: Diagnosis not present

## 2018-03-26 MED ORDER — GABAPENTIN 600 MG PO TABS: 1200.0000 mg | ORAL_TABLET | Freq: Three times a day (TID) | ORAL | 1 refills | Status: DC

## 2018-03-26 NOTE — Patient Instructions (Signed)
Please continue all other medications as before, and refills have been done if requested.  Please have the pharmacy call with any other refills you may need.  Please continue your efforts at being more active, low cholesterol diet, and weight control.  You are otherwise up to date with prevention measures today.  Please keep your appointments with your specialists as you may have planned  You will be contacted regarding the referral for: colonoscopy, endocrinology, and podiatry (foot doctor)  Please return in 6 months, or sooner if needed, with Lab testing done 3-5 days before

## 2018-03-26 NOTE — Assessment & Plan Note (Signed)

## 2018-03-26 NOTE — Assessment & Plan Note (Signed)
D/with pt, to work on DM control, f/u lab

## 2018-03-26 NOTE — Progress Notes (Signed)
Subjective:    Patient ID: John Dalton, male    DOB: 1961/01/18, 58 y.o.   MRN: [id number]  HPI  Here for wellness and f/u;  Overall doing ok;  Pt denies Chest pain, worsening SOB, DOE, wheezing, orthopnea, PND, worsening LE edema, palpitations, dizziness or syncope.  Pt denies neurological change such as new headache, facial or extremity weakness.  Pt denies polydipsia, polyuria, or low sugar symptoms. Pt states overall good compliance with treatment and medications, good tolerability, and has been trying to follow appropriate diet.  Pt denies worsening depressive symptoms, suicidal ideation or panic. No fever, night sweats, wt loss, loss of appetite, or other constitutional symptoms.  Pt states good ability with ADL's, has low fall risk, home safety reviewed and adequate, no other significant changes in hearing or vision, and only occasionally active with exercise.  Plans to call for eye doctor exam soon. He is aok with podiatry referral with persistent neuropahtic feet pain.   Ran out of insulin just last night.  Finances tight, hard to afford.  Gets is diability check next wk.  Not taking jardiance or statin due to cost except for a couple of pills lately.   Past Medical History:  Diagnosis Date  . Allergy   . Diabetes mellitus (HCC)   . Diverticulitis   . Environmental and seasonal allergies   . 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 HTN (hypertension)   . Hypercholesteremia   . Neuropathy 01/11/2017   Past Surgical History:  Procedure Laterality Date  . Colon resection      reports that he has been smoking cigarettes. He has been smoking about 0.00 packs per day for the past 20.00 years. He has never used smokeless tobacco. He reports that he does not drink alcohol or use drugs. family history includes Diabetes in his father, paternal grandfather, and paternal grandmother; Hypertension in his maternal grandfather, maternal grandmother, mother,  paternal grandfather, paternal grandmother, and sister. Allergies  Allergen Reactions  . Metformin And Related Diarrhea   Current Outpatient Medications on File Prior to Visit  Medication Sig Dispense Refill  . aspirin EC 81 MG tablet Take 81 mg by mouth daily.    Marland Kitchen atenolol (TENORMIN) 50 MG tablet Take 1 tablet (50 mg total) by mouth daily. 90 tablet 1  . cyclobenzaprine (FLEXERIL) 5 MG tablet Take 1 tablet (5 mg total) by mouth 3 (three) times daily as needed for muscle spasms. 60 tablet 1  . insulin NPH-regular Human (NOVOLIN 70/30 RELION) (70-30) 100 UNIT/ML injection INJECT 75 UNITS SUBCUTANEOUSLY TWICE DAILY WITH A MEAls 30 mL 5  . Insulin Syringe-Needle U-100 (BD INSULIN SYRINGE ULTRAFINE) 31G X 15/64" 0.5 ML MISC Use 1 needle per injection to inject insulin 1-4 times daily as instructed. 100 each 11  . lisinopril (PRINIVIL,ZESTRIL) 40 MG tablet Take 1 tablet (40 mg total) by mouth daily. 90 tablet 1  . Multiple Vitamins-Minerals (CENTRUM ADULTS PO) Take 1 tablet daily by mouth.    . rosuvastatin (CRESTOR) 40 MG tablet Take 1 tablet (40 mg total) by mouth daily. 90 tablet 3  . VITAMIN E PO Take 1 tablet daily by mouth.     No current facility-administered medications on file prior to visit.    Review of Systems Constitutional: Negative for other unusual diaphoresis, sweats, appetite or weight changes HENT: Negative for other worsening hearing loss, ear pain, facial swelling, mouth sores or neck stiffness.   Eyes: Negative  for other worsening pain, redness or other visual disturbance.  Respiratory: Negative for other stridor or swelling Cardiovascular: Negative for other palpitations or other chest pain  Gastrointestinal: Negative for worsening diarrhea or loose stools, blood in stool, distention or other pain Genitourinary: Negative for hematuria, flank pain or other change in urine volume.  Musculoskeletal: Negative for myalgias or other joint swelling.  Skin: Negative for other  color change, or other wound or worsening drainage.  Neurological: Negative for other syncope or numbness. Hematological: Negative for other adenopathy or swelling Psychiatric/Behavioral: Negative for hallucinations, other worsening agitation, SI, self-injury, or new decreased concentration ALl other system neg per pt    Objective:   Physical Exam BP (!) 142/94   Pulse 83   Temp 98.1 F (36.7 C) (Oral)   Ht 5' 10.5" (1.791 m)   Wt 213 lb (96.6 kg)   SpO2 98%   BMI 30.13 kg/m  VS noted,  Constitutional: Pt is oriented to person, place, and time. Appears well-developed and well-nourished, in no significant distress and comfortable Head: Normocephalic and atraumatic  Eyes: Conjunctivae and EOM are normal. Pupils are equal, round, and reactive to light Right Ear: External ear normal without discharge Left Ear: External ear normal without discharge Nose: Nose without discharge or deformity Mouth/Throat: Oropharynx is without other ulcerations and moist  Neck: Normal range of motion. Neck supple. No JVD present. No tracheal deviation present or significant neck LA or mass Cardiovascular: Normal rate, regular rhythm, normal heart sounds and intact distal pulses.   Pulmonary/Chest: WOB normal and breath sounds without rales or wheezing  Abdominal: Soft. Bowel sounds are normal. NT. No HSM  Musculoskeletal: Normal range of motion. Exhibits no edema Lymphadenopathy: Has no other cervical adenopathy.  Neurological: Pt is alert and oriented to person, place, and time. Pt has normal reflexes. No cranial nerve deficit. Motor grossly intact, Decresaed sensation to LT to LE's Skin: Skin is warm and dry. No rash noted or new ulcerations Psychiatric:  Has normal mood and affect. Behavior is normal without agitation No other exam findings Lab Results  Component Value Date   WBC 7.0 03/17/2018   HGB 15.0 03/17/2018   HCT 44.8 03/17/2018   PLT 276.0 03/17/2018   GLUCOSE 285 (H) 03/17/2018   CHOL  214 (H) 03/17/2018   TRIG 145.0 03/17/2018   HDL 26.30 (L) 03/17/2018   LDLDIRECT 60.0 04/10/2017   LDLCALC 158 (H) 03/17/2018   ALT 12 03/17/2018   AST 12 03/17/2018   NA 136 03/17/2018   K 4.6 03/17/2018   CL 99 03/17/2018   CREATININE 1.86 (H) 03/17/2018   BUN 27 (H) 03/17/2018   CO2 26 03/17/2018   TSH 1.43 03/17/2018   PSA 1.86 03/17/2018   HGBA1C 13.3 (H) 03/17/2018   MICROALBUR 6.2 (H) 03/17/2018       Assessment & Plan:

## 2018-03-26 NOTE — Assessment & Plan Note (Signed)
Severe uncontrolled, compliance and med costs are major factor, for optho, endo referral, cont insulin as he can afford, to ER for worsening s/s

## 2018-04-21 ENCOUNTER — Other Ambulatory Visit: Payer: Self-pay

## 2018-04-21 ENCOUNTER — Emergency Department (HOSPITAL_COMMUNITY): Payer: Medicare HMO

## 2018-04-21 ENCOUNTER — Observation Stay (HOSPITAL_COMMUNITY): Payer: Medicare HMO

## 2018-04-21 ENCOUNTER — Encounter (HOSPITAL_COMMUNITY): Payer: Self-pay

## 2018-04-21 ENCOUNTER — Observation Stay (HOSPITAL_BASED_OUTPATIENT_CLINIC_OR_DEPARTMENT_OTHER): Payer: Medicare HMO

## 2018-04-21 ENCOUNTER — Inpatient Hospital Stay (HOSPITAL_COMMUNITY)
Admission: EM | Admit: 2018-04-21 | Discharge: 2018-04-24 | DRG: 286 | Disposition: A | Discharge status: Home / Self Care | Payer: Medicare HMO | Attending: Internal Medicine | Admitting: Internal Medicine

## 2018-04-21 DIAGNOSIS — Z8249 Family history of ischemic heart disease and other diseases of the circulatory system: Secondary | ICD-10-CM | POA: Diagnosis not present

## 2018-04-21 DIAGNOSIS — E1140 Type 2 diabetes mellitus with diabetic neuropathy, unspecified: Secondary | ICD-10-CM | POA: Diagnosis present

## 2018-04-21 DIAGNOSIS — I252 Old myocardial infarction: Secondary | ICD-10-CM | POA: Diagnosis not present

## 2018-04-21 DIAGNOSIS — E876 Hypokalemia: Secondary | ICD-10-CM | POA: Diagnosis present

## 2018-04-21 DIAGNOSIS — E1142 Type 2 diabetes mellitus with diabetic polyneuropathy: Secondary | ICD-10-CM | POA: Diagnosis not present

## 2018-04-21 DIAGNOSIS — I200 Unstable angina: Secondary | ICD-10-CM | POA: Diagnosis not present

## 2018-04-21 DIAGNOSIS — I2510 Atherosclerotic heart disease of native coronary artery without angina pectoris: Secondary | ICD-10-CM | POA: Diagnosis present

## 2018-04-21 DIAGNOSIS — Z833 Family history of diabetes mellitus: Secondary | ICD-10-CM | POA: Diagnosis not present

## 2018-04-21 DIAGNOSIS — Y92009 Unspecified place in unspecified non-institutional (private) residence as the place of occurrence of the external cause: Secondary | ICD-10-CM | POA: Diagnosis not present

## 2018-04-21 DIAGNOSIS — Z7982 Long term (current) use of aspirin: Secondary | ICD-10-CM

## 2018-04-21 DIAGNOSIS — R079 Chest pain, unspecified: Secondary | ICD-10-CM

## 2018-04-21 DIAGNOSIS — I10 Essential (primary) hypertension: Secondary | ICD-10-CM | POA: Diagnosis present

## 2018-04-21 DIAGNOSIS — E785 Hyperlipidemia, unspecified: Secondary | ICD-10-CM | POA: Diagnosis present

## 2018-04-21 DIAGNOSIS — E1165 Type 2 diabetes mellitus with hyperglycemia: Secondary | ICD-10-CM | POA: Diagnosis not present

## 2018-04-21 DIAGNOSIS — R9431 Abnormal electrocardiogram [ECG] [EKG]: Secondary | ICD-10-CM

## 2018-04-21 DIAGNOSIS — E782 Mixed hyperlipidemia: Secondary | ICD-10-CM | POA: Diagnosis not present

## 2018-04-21 DIAGNOSIS — E1122 Type 2 diabetes mellitus with diabetic chronic kidney disease: Secondary | ICD-10-CM | POA: Diagnosis present

## 2018-04-21 DIAGNOSIS — N183 Chronic kidney disease, stage 3 (moderate): Secondary | ICD-10-CM | POA: Diagnosis present

## 2018-04-21 DIAGNOSIS — E1110 Type 2 diabetes mellitus with ketoacidosis without coma: Secondary | ICD-10-CM | POA: Diagnosis present

## 2018-04-21 DIAGNOSIS — R0789 Other chest pain: Secondary | ICD-10-CM | POA: Diagnosis present

## 2018-04-21 DIAGNOSIS — E1100 Type 2 diabetes mellitus with hyperosmolarity without nonketotic hyperglycemic-hyperosmolar coma (NKHHC): Secondary | ICD-10-CM | POA: Diagnosis present

## 2018-04-21 DIAGNOSIS — Z79899 Other long term (current) drug therapy: Secondary | ICD-10-CM | POA: Diagnosis not present

## 2018-04-21 DIAGNOSIS — F17210 Nicotine dependence, cigarettes, uncomplicated: Secondary | ICD-10-CM | POA: Diagnosis present

## 2018-04-21 DIAGNOSIS — R7611 Nonspecific reaction to tuberculin skin test without active tuberculosis: Secondary | ICD-10-CM | POA: Diagnosis present

## 2018-04-21 DIAGNOSIS — R710 Precipitous drop in hematocrit: Secondary | ICD-10-CM

## 2018-04-21 DIAGNOSIS — E119 Type 2 diabetes mellitus without complications: Secondary | ICD-10-CM

## 2018-04-21 DIAGNOSIS — Z91120 Patient's intentional underdosing of medication regimen due to financial hardship: Secondary | ICD-10-CM

## 2018-04-21 DIAGNOSIS — D649 Anemia, unspecified: Secondary | ICD-10-CM | POA: Diagnosis not present

## 2018-04-21 DIAGNOSIS — T383X6A Underdosing of insulin and oral hypoglycemic [antidiabetic] drugs, initial encounter: Secondary | ICD-10-CM | POA: Diagnosis present

## 2018-04-21 DIAGNOSIS — Z9049 Acquired absence of other specified parts of digestive tract: Secondary | ICD-10-CM | POA: Diagnosis not present

## 2018-04-21 DIAGNOSIS — Z9114 Patient's other noncompliance with medication regimen: Secondary | ICD-10-CM | POA: Diagnosis not present

## 2018-04-21 DIAGNOSIS — D509 Iron deficiency anemia, unspecified: Secondary | ICD-10-CM | POA: Diagnosis present

## 2018-04-21 DIAGNOSIS — Z794 Long term (current) use of insulin: Secondary | ICD-10-CM

## 2018-04-21 DIAGNOSIS — R739 Hyperglycemia, unspecified: Secondary | ICD-10-CM

## 2018-04-21 DIAGNOSIS — I129 Hypertensive chronic kidney disease with stage 1 through stage 4 chronic kidney disease, or unspecified chronic kidney disease: Secondary | ICD-10-CM | POA: Diagnosis present

## 2018-04-21 LAB — CBC
HCT: 34.6 % — ABNORMAL LOW (ref 39.0–52.0)
Hemoglobin: 11.9 g/dL — ABNORMAL LOW (ref 13.0–17.0)
MCH: 27.1 pg (ref 26.0–34.0)
MCHC: 34.4 g/dL (ref 30.0–36.0)
MCV: 78.8 fL — ABNORMAL LOW (ref 80.0–100.0)
NRBC: 0 % (ref 0.0–0.2)
Platelets: 154 10*3/uL (ref 150–400)
RBC: 4.39 MIL/uL (ref 4.22–5.81)
RDW: 13.6 % (ref 11.5–15.5)
WBC: 5.4 10*3/uL (ref 4.0–10.5)

## 2018-04-21 LAB — COMPREHENSIVE METABOLIC PANEL
ALT: 14 U/L (ref 0–44)
AST: 12 U/L — ABNORMAL LOW (ref 15–41)
Albumin: 3.3 g/dL — ABNORMAL LOW (ref 3.5–5.0)
Alkaline Phosphatase: 82 U/L (ref 38–126)
Anion gap: 11 (ref 5–15)
BUN: 14 mg/dL (ref 6–20)
CO2: 20 mmol/L — ABNORMAL LOW (ref 22–32)
Calcium: 8.8 mg/dL — ABNORMAL LOW (ref 8.9–10.3)
Chloride: 96 mmol/L — ABNORMAL LOW (ref 98–111)
Creatinine, Ser: 1.48 mg/dL — ABNORMAL HIGH (ref 0.61–1.24)
GFR calc non Af Amer: 51 mL/min — ABNORMAL LOW (ref >60)
GFR, EST AFRICAN AMERICAN: 60 mL/min — AB (ref >60)
Glucose, Bld: 797 mg/dL (ref 70–99)
Potassium: 3.4 mmol/L — ABNORMAL LOW (ref 3.5–5.1)
Sodium: 127 mmol/L — ABNORMAL LOW (ref 135–145)
Total Bilirubin: 0.4 mg/dL (ref 0.3–1.2)
Total Protein: 6 g/dL — ABNORMAL LOW (ref 6.5–8.1)

## 2018-04-21 LAB — URINALYSIS, ROUTINE W REFLEX MICROSCOPIC
Bacteria, UA: NONE SEEN
Bilirubin Urine: NEGATIVE
Glucose, UA: >=500 mg/dL — AB
Hgb urine dipstick: NEGATIVE
Ketones, ur: NEGATIVE mg/dL
Leukocytes,Ua: NEGATIVE
Nitrite: NEGATIVE
Protein, ur: NEGATIVE mg/dL
Specific Gravity, Urine: 1.022 (ref 1.005–1.030)
pH: 6 (ref 5.0–8.0)

## 2018-04-21 LAB — LIPID PANEL
Cholesterol: 154 mg/dL (ref 0–200)
HDL: 22 mg/dL — AB (ref >40)
LDL Cholesterol: 110 mg/dL — ABNORMAL HIGH (ref 0–99)
Total CHOL/HDL Ratio: 7 RATIO
Triglycerides: 111 mg/dL (ref <150)
VLDL: 22 mg/dL (ref 0–40)

## 2018-04-21 LAB — TROPONIN I
Troponin I: <0.03 ng/mL (ref <0.03)
Troponin I: <0.03 ng/mL (ref <0.03)
Troponin I: <0.03 ng/mL (ref <0.03)

## 2018-04-21 LAB — IRON AND TIBC
Iron: 41 ug/dL — ABNORMAL LOW (ref 45–182)
Saturation Ratios: 17 % — ABNORMAL LOW (ref 17.9–39.5)
TIBC: 245 ug/dL — ABNORMAL LOW (ref 250–450)
UIBC: 204 ug/dL

## 2018-04-21 LAB — BASIC METABOLIC PANEL
ANION GAP: 8 (ref 5–15)
Anion gap: 10 (ref 5–15)
BUN: 11 mg/dL (ref 6–20)
BUN: 9 mg/dL (ref 6–20)
CALCIUM: 8.7 mg/dL — AB (ref 8.9–10.3)
CALCIUM: 8.7 mg/dL — AB (ref 8.9–10.3)
CO2: 22 mmol/L (ref 22–32)
CO2: 22 mmol/L (ref 22–32)
Chloride: 107 mmol/L (ref 98–111)
Chloride: 109 mmol/L (ref 98–111)
Creatinine, Ser: 1.1 mg/dL (ref 0.61–1.24)
Creatinine, Ser: 1.06 mg/dL (ref 0.61–1.24)
GFR calc Af Amer: >60 mL/min (ref >60)
GFR calc Af Amer: >60 mL/min (ref >60)
GFR calc non Af Amer: >60 mL/min (ref >60)
GFR calc non Af Amer: >60 mL/min (ref >60)
Glucose, Bld: 349 mg/dL — ABNORMAL HIGH (ref 70–99)
Glucose, Bld: 204 mg/dL — ABNORMAL HIGH (ref 70–99)
Potassium: 3.1 mmol/L — ABNORMAL LOW (ref 3.5–5.1)
Potassium: 3 mmol/L — ABNORMAL LOW (ref 3.5–5.1)
Sodium: 137 mmol/L (ref 135–145)
Sodium: 141 mmol/L (ref 135–145)

## 2018-04-21 LAB — GLUCOSE, CAPILLARY
GLUCOSE-CAPILLARY: 172 mg/dL — AB (ref 70–99)
Glucose-Capillary: 391 mg/dL — ABNORMAL HIGH (ref 70–99)
Glucose-Capillary: 309 mg/dL — ABNORMAL HIGH (ref 70–99)
Glucose-Capillary: 279 mg/dL — ABNORMAL HIGH (ref 70–99)
Glucose-Capillary: 217 mg/dL — ABNORMAL HIGH (ref 70–99)
Glucose-Capillary: 168 mg/dL — ABNORMAL HIGH (ref 70–99)
Glucose-Capillary: 208 mg/dL — ABNORMAL HIGH (ref 70–99)
Glucose-Capillary: 290 mg/dL — ABNORMAL HIGH (ref 70–99)
Glucose-Capillary: 359 mg/dL — ABNORMAL HIGH (ref 70–99)

## 2018-04-21 LAB — FOLATE: Folate: 12.1 ng/mL (ref >5.9)

## 2018-04-21 LAB — MRSA PCR SCREENING: MRSA by PCR: NEGATIVE

## 2018-04-21 LAB — POC OCCULT BLOOD, ED: Fecal Occult Bld: NEGATIVE

## 2018-04-21 LAB — ECHOCARDIOGRAM COMPLETE
Height: 70 in
Weight: 3280 oz

## 2018-04-21 LAB — FERRITIN: Ferritin: 237 ng/mL (ref 24–336)

## 2018-04-21 LAB — CBG MONITORING, ED
Glucose-Capillary: >600 mg/dL (ref 70–99)
Glucose-Capillary: >600 mg/dL (ref 70–99)

## 2018-04-21 LAB — HEMOGLOBIN A1C
Hgb A1c MFr Bld: 13.3 % — ABNORMAL HIGH (ref 4.8–5.6)
Mean Plasma Glucose: 335.01 mg/dL

## 2018-04-21 LAB — VITAMIN B12: Vitamin B-12: 972 pg/mL — ABNORMAL HIGH (ref 180–914)

## 2018-04-21 MED ORDER — ONDANSETRON HCL 4 MG/2ML IJ SOLN: 4.0000 mg | Freq: Four times a day (QID) | INTRAMUSCULAR | Status: DC | PRN

## 2018-04-21 MED ORDER — CARVEDILOL 12.5 MG PO TABS
12.5000 mg | ORAL_TABLET | Freq: Two times a day (BID) | ORAL | Status: DC
  Administered 2018-04-21 – 2018-04-23 (×5): 12.5 mg via ORAL
  Filled 2018-04-21 (×5): qty 1

## 2018-04-21 MED ORDER — INSULIN ASPART 100 UNIT/ML ~~LOC~~ SOLN
4.0000 [IU] | Freq: Three times a day (TID) | SUBCUTANEOUS | Status: DC
  Administered 2018-04-21: 4 [IU] via SUBCUTANEOUS

## 2018-04-21 MED ORDER — DEXTROSE-NACL 5-0.45 % IV SOLN: INTRAVENOUS | Status: DC

## 2018-04-21 MED ORDER — DEXTROSE-NACL 5-0.45 % IV SOLN
INTRAVENOUS | Status: DC
  Administered 2018-04-21: 13:00:00 via INTRAVENOUS

## 2018-04-21 MED ORDER — SODIUM CHLORIDE 0.9 % IV SOLN
INTRAVENOUS | Status: DC
  Administered 2018-04-21: 07:00:00 via INTRAVENOUS

## 2018-04-21 MED ORDER — ACETAMINOPHEN 325 MG PO TABS: 650.0000 mg | ORAL_TABLET | Freq: Four times a day (QID) | ORAL | Status: DC | PRN

## 2018-04-21 MED ORDER — INSULIN REGULAR(HUMAN) IN NACL 100-0.9 UT/100ML-% IV SOLN: INTRAVENOUS | Status: DC

## 2018-04-21 MED ORDER — ASPIRIN EC 81 MG PO TBEC
81.0000 mg | DELAYED_RELEASE_TABLET | Freq: Every day | ORAL | Status: DC
  Administered 2018-04-21 – 2018-04-22 (×2): 81 mg via ORAL
  Filled 2018-04-21 (×2): qty 1

## 2018-04-21 MED ORDER — INSULIN ASPART 100 UNIT/ML ~~LOC~~ SOLN
8.0000 [IU] | Freq: Three times a day (TID) | SUBCUTANEOUS | Status: DC
  Administered 2018-04-22 – 2018-04-24 (×6): 8 [IU] via SUBCUTANEOUS

## 2018-04-21 MED ORDER — GABAPENTIN 300 MG PO CAPS
600.0000 mg | ORAL_CAPSULE | Freq: Three times a day (TID) | ORAL | Status: DC
  Administered 2018-04-21 – 2018-04-24 (×9): 600 mg via ORAL
  Filled 2018-04-21 (×9): qty 2

## 2018-04-21 MED ORDER — POTASSIUM CHLORIDE 10 MEQ/100ML IV SOLN
10.0000 meq | INTRAVENOUS | Status: DC
  Administered 2018-04-21: 10 meq via INTRAVENOUS
  Filled 2018-04-21: qty 100

## 2018-04-21 MED ORDER — INSULIN ASPART 100 UNIT/ML ~~LOC~~ SOLN
0.0000 [IU] | Freq: Three times a day (TID) | SUBCUTANEOUS | Status: DC
  Administered 2018-04-22 (×2): 11 [IU] via SUBCUTANEOUS
  Administered 2018-04-22: 7 [IU] via SUBCUTANEOUS
  Administered 2018-04-23: 11 [IU] via SUBCUTANEOUS
  Administered 2018-04-23: 20 [IU] via SUBCUTANEOUS
  Administered 2018-04-24: 11 [IU] via SUBCUTANEOUS

## 2018-04-21 MED ORDER — SODIUM CHLORIDE 0.9 % IV BOLUS
1000.0000 mL | Freq: Once | INTRAVENOUS | Status: AC
  Administered 2018-04-21: 1000 mL via INTRAVENOUS

## 2018-04-21 MED ORDER — INSULIN ASPART 100 UNIT/ML ~~LOC~~ SOLN
0.0000 [IU] | Freq: Every day | SUBCUTANEOUS | Status: DC
  Administered 2018-04-21: 5 [IU] via SUBCUTANEOUS
  Administered 2018-04-22 – 2018-04-23 (×2): 3 [IU] via SUBCUTANEOUS

## 2018-04-21 MED ORDER — CHLORHEXIDINE GLUCONATE CLOTH 2 % EX PADS
6.0000 | MEDICATED_PAD | Freq: Every day | CUTANEOUS | Status: DC
  Administered 2018-04-22: 6 via TOPICAL

## 2018-04-21 MED ORDER — ENOXAPARIN SODIUM 40 MG/0.4ML ~~LOC~~ SOLN
40.0000 mg | SUBCUTANEOUS | Status: DC
  Administered 2018-04-21 – 2018-04-22 (×2): 40 mg via SUBCUTANEOUS
  Filled 2018-04-21 (×3): qty 0.4

## 2018-04-21 MED ORDER — SODIUM CHLORIDE 0.9% FLUSH
3.0000 mL | Freq: Two times a day (BID) | INTRAVENOUS | Status: DC
  Administered 2018-04-21 – 2018-04-24 (×6): 3 mL via INTRAVENOUS

## 2018-04-21 MED ORDER — ROSUVASTATIN CALCIUM 20 MG PO TABS
40.0000 mg | ORAL_TABLET | Freq: Every day | ORAL | Status: DC
  Administered 2018-04-21 – 2018-04-24 (×4): 40 mg via ORAL
  Filled 2018-04-21 (×4): qty 2

## 2018-04-21 MED ORDER — ONDANSETRON HCL 4 MG PO TABS: 4.0000 mg | ORAL_TABLET | Freq: Four times a day (QID) | ORAL | Status: DC | PRN

## 2018-04-21 MED ORDER — INSULIN ASPART 100 UNIT/ML ~~LOC~~ SOLN
0.0000 [IU] | Freq: Three times a day (TID) | SUBCUTANEOUS | Status: DC
  Administered 2018-04-21: 5 [IU] via SUBCUTANEOUS

## 2018-04-21 MED ORDER — ACETAMINOPHEN 650 MG RE SUPP: 650.0000 mg | Freq: Four times a day (QID) | RECTAL | Status: DC | PRN

## 2018-04-21 MED ORDER — INSULIN REGULAR(HUMAN) IN NACL 100-0.9 UT/100ML-% IV SOLN
INTRAVENOUS | Status: DC
  Administered 2018-04-21: 5.4 [IU]/h via INTRAVENOUS
  Filled 2018-04-21: qty 100

## 2018-04-21 MED ORDER — POTASSIUM CHLORIDE CRYS ER 20 MEQ PO TBCR
40.0000 meq | EXTENDED_RELEASE_TABLET | Freq: Once | ORAL | Status: AC
  Administered 2018-04-21: 40 meq via ORAL
  Filled 2018-04-21: qty 2

## 2018-04-21 MED ORDER — INSULIN DETEMIR 100 UNIT/ML ~~LOC~~ SOLN
30.0000 [IU] | Freq: Every day | SUBCUTANEOUS | Status: DC
  Administered 2018-04-21: 30 [IU] via SUBCUTANEOUS
  Filled 2018-04-21: qty 0.3

## 2018-04-21 MED ORDER — ATENOLOL 25 MG PO TABS: 50.0000 mg | ORAL_TABLET | Freq: Every day | ORAL | Status: DC

## 2018-04-21 MED ORDER — INSULIN DETEMIR 100 UNIT/ML ~~LOC~~ SOLN
30.0000 [IU] | Freq: Two times a day (BID) | SUBCUTANEOUS | Status: DC
  Administered 2018-04-21 – 2018-04-23 (×4): 30 [IU] via SUBCUTANEOUS
  Filled 2018-04-21 (×6): qty 0.3

## 2018-04-21 MED ORDER — SODIUM CHLORIDE 0.9 % IV SOLN: INTRAVENOUS | Status: DC

## 2018-04-21 MED ORDER — LISINOPRIL 20 MG PO TABS
40.0000 mg | ORAL_TABLET | Freq: Every day | ORAL | Status: DC
  Administered 2018-04-21 – 2018-04-24 (×4): 40 mg via ORAL
  Filled 2018-04-21 (×3): qty 4
  Filled 2018-04-21: qty 2

## 2018-04-21 NOTE — ED Notes (Signed)
Bed: EZ66 Expected date:  Expected time:  Means of arrival:  Comments: 58 yo M/ Hyperglycemia

## 2018-04-21 NOTE — TOC Initial Note (Signed)
Transition of Care [facility]) - Initial/Assessment Note    Patient Details  Name: John Dalton MRN: [id number] Date of Birth: 1961-01-25  Transition [facility] CM/SW Contact:    Jerzi Tigert, Meriam Sprague, RN Phone [phone] 04/21/2018, 11:48 AM  Clinical Narrative:                 CM consult for medication needs. This CM spoke with pt who states he gave a family member some money so he couldn't afford his insulin. He states that he does have his [facility] and $29 is his copay for his insulin. He states he gets his disability check next week and will be able to afford it then. Pt encouraged to borrow money from friends or family to get his insulin until next week.  Expected Discharge Plan: Home/Self Care Barriers to Discharge: No Barriers Identified   Patient Goals and CMS Choice        Expected Discharge Plan and Services Expected Discharge Plan: Home/Self Care   Discharge Planning Services: CM Consult   Living arrangements for the past 2 months: Apartment                          Prior Living Arrangements/Services Living arrangements for the past 2 months: Apartment Lives with:: Spouse Patient language and need for interpreter reviewed:: Yes Do you feel safe going back to the place where you live?: Yes      Need for Family Participation in Patient Care: Yes (Comment) Care giver support system in place?: Yes (comment)   Criminal Activity/Legal Involvement Pertinent to Current Situation/Hospitalization: No - Comment as needed  Activities of Daily Living Home Assistive Devices/Equipment: None ADL Screening (condition at time of admission) Patient's cognitive ability adequate to safely complete daily activities?: Yes Is the patient deaf or have difficulty hearing?: No Does the patient have difficulty seeing, even when wearing glasses/contacts?: No Does the patient have difficulty concentrating, remembering, or making decisions?: No Patient able to express need  for assistance with ADLs?: No Does the patient have difficulty dressing or bathing?: No Independently performs ADLs?: Yes (appropriate for developmental age) Does the patient have difficulty walking or climbing stairs?: No Weakness of Legs: None Weakness of Arms/Hands: None  Permission Sought/Granted                  Emotional Assessment Appearance:: Appears stated age Attitude/Demeanor/Rapport: Guarded Affect (typically observed): Appropriate Orientation: : Oriented to Self, Oriented to Place, Oriented to  Time, Oriented to Situation Alcohol / Substance Use: Not Applicable Psych Involvement: No (comment)  Admission diagnosis:  Hyperglycemia [R73.9] Atypical chest pain [R07.89] Decreased hemoglobin [R71.0] Patient Active Problem List   Diagnosis Date Noted  . DKA (diabetic ketoacidoses) (HCC) 04/21/2018  . CAD (coronary artery disease) 04/21/2018  . Type 2 diabetes mellitus with hyperosmolar nonketotic hyperglycemia (HCC) 04/21/2018  . Intermittent left-sided chest pain 04/21/2018  . CKD (chronic kidney disease) stage 3, GFR 30-59 ml/min (HCC) 03/26/2018  . Alopecia 06/13/2017  . Neuropathy 01/11/2017  . Erectile dysfunction 12/29/2015  . Abscess of back 12/11/2015  . Heart palpitations 09/25/2015  . Stye external 09/25/2015  . Type 2 diabetes mellitus (HCC) 08/16/2015  . Preventative health care 08/16/2015  . Medicare annual wellness visit, subsequent 08/16/2015  . Hypertension, essential 08/16/2015  . Hyperlipidemia 08/16/2015  . Obesity 08/16/2015  . Impacted cerumen of both ears 08/16/2015   PCP:  Corwin Levins, MD Pharmacy:   [facility] [id number] -  [place], [place] - [address] [place] [address] Phone: [phone] Fax: [phone]     Social Determinants of Health (SDOH) Interventions    Readmission Risk Interventions No flowsheet data found.

## 2018-04-21 NOTE — H&P (Signed)
History and Physical   John Dalton PQZ:[id number] DOB: 10-Jan-1961 DOA: 04/21/2018  Referring MD/NP/PA: Frederik Pear, PA, EDP PCP: Corwin Levins, MD Outpatient Specialists: None  Patient coming from: Home  Chief Complaint: Chest pain, hyperglycemia  HPI: John Dalton is a 58 y.o. male with a history of IDT2DM, CAD s/p MI 2006, HTN, HLD who presented to the ED with chest pain in the setting of not taking medications for the previous 2 months. He described sharp, severe, abrupt onset chest pain last night that radiated down the left arm reminiscent of symptoms from prior ACS. He was walking to the bathroom leisurely and noted the pain resolved in about a minute without intervention. There was no associated dyspnea, palpitations, and no recent orthopnea, PND, or leg swelling. This was worse than, but similar to previous episodes over the past month or so occurring sporadically without provocative or alleviating factors, about 1-2 times per week. He took aspirin and called EMS who also administered NTG though CP had resolved. He also notes that he's had CBG readings of HIGH over the previous month with associated polyuria, polydipsia, polyphagia and unintentional weight loss. He's been sending money home to family in the [place] instead of buying and taking most of his medications.   ED Course: Afebrile with HTN in ED. Troponin negative x1, ECG shows T wave inversions in inferior and lateral leads that are more prominent than prior tracings which do also show nonspecific abnormalities/T wave flattening. CXR neg. Glucose found to be 797 with bicarb 20, anion gap normal at 11. UA with glucosuria without ketonuria and no pyuria. Na 127, K 3.4, and creatinine near baseline at 1.48. Hgb 11.9, down from previous, FOBT negative and no reported bleeding. Admission was requested, to SDU on insulin gtt.   Review of Systems: Denies fever, chills, blurry vision, headache, cough, sore throat, palpitations, shortness  of breath, abdominal pain, nausea, vomiting, changes in bowel habits, blood in stool, myalgias, arthralgias, and rash, and per HPI. All others reviewed and are negative.   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 No EtOH, lives with wife, daughter lives locally. Originally from the [place], [place] and still has family there.   reports that he has been smoking cigarettes. He has been smoking about 0.00 packs per day for the past 20.00 years. He has never used smokeless tobacco. He reports that he does not drink alcohol or use drugs. Allergies  Allergen Reactions   Metformin And Related Diarrhea   Family History  Problem Relation Age of Onset   Diabetes Father    Hypertension Mother    Hypertension Sister    Diabetes Paternal Grandmother    Hypertension Paternal Grandmother    Diabetes Paternal Grandfather    Hypertension Paternal Grandfather    Hypertension Maternal Grandmother    Hypertension Maternal Grandfather    - Family history otherwise reviewed and not pertinent.  Prior to Admission medications   Medication Sig Start Date End Date Taking? Authorizing Provider  aspirin EC 81 MG tablet Take 81 mg by mouth daily.   Yes [provider]  atenolol (TENORMIN) 50 MG tablet Take 1 tablet (50 mg total) by mouth daily. 12/15/17  Yes Corwin Levins, MD  gabapentin (NEURONTIN) 600 MG tablet Take 2 tablets (1,200 mg total) by mouth 3 (three) times daily. 03/26/18  Yes Corwin Levins, MD  insulin NPH-regular Human (NOVOLIN 70/30 RELION) (70-30) 100 UNIT/ML injection INJECT 75 UNITS SUBCUTANEOUSLY TWICE DAILY WITH A MEAls 12/15/17  Yes Corwin Levins, MD  lisinopril (PRINIVIL,ZESTRIL) 40 MG tablet Take 1 tablet (40 mg total) by mouth daily. 12/15/17  Yes Corwin Levins, MD  Multiple Vitamins-Minerals (CENTRUM ADULTS PO) Take 1 tablet daily by mouth.   Yes [provider]  rosuvastatin (CRESTOR) 40 MG tablet Take 1 tablet (40 mg total) by mouth daily. 12/15/17  Yes Corwin Levins, MD  cyclobenzaprine (FLEXERIL) 5 MG tablet Take 1 tablet (5 mg total) by mouth 3 (three) times daily as needed for muscle spasms. Patient not taking: Reported on 04/21/2018 12/15/17   Corwin Levins, MD  Insulin Syringe-Needle U-100 (BD INSULIN SYRINGE ULTRAFINE) 31G X 15/64" 0.5 ML MISC Use 1 needle per injection to inject insulin 1-4 times daily as instructed. 06/13/17   Corwin Levins, MD    Physical Exam: Vitals:   04/21/18 0552 04/21/18 0600 04/21/18 0630 04/21/18 0700  BP:  (!) 152/67 (!) 154/75 (!) 160/84  Pulse: 63     Resp: Temp:      TempSrc:      SpO2: 97%     Weight:      Height:       Constitutional: 58 y.o. male in no distress, calm demeanor Eyes: Lids and conjunctivae normal, PERRL ENMT: Mucous membranes are tackty. Posterior pharynx clear of any exudate or lesions. Fair dentition.  Neck: normal, supple, no masses, no thyromegaly Respiratory: Non-labored slightly tachypneic without accessory muscle use. Clear breath sounds to auscultation bilaterally Cardiovascular: Regular rate and rhythm, no murmurs, rubs, or gallops. No carotid bruits. No JVD. No LE edema. 2+ pedal pulses. Abdomen: Normoactive bowel sounds. No tenderness, non-distended, and no masses palpated. No hepatosplenomegaly. GU: No indwelling catheter Musculoskeletal: No clubbing / cyanosis. No joint deformity upper and lower extremities. Good ROM, no contractures. Normal muscle tone.  Skin: Warm, dry. No rashes, wounds, or ulcers on visualized skin.  Neurologic: CN II-XII grossly intact. Gait steady. Speech normal. No focal deficits in motor strength or  sensation in all extremities.  Psychiatric: Alert and oriented x3. Normal judgment and insight. Mood irritable with broad affect.   Labs on Admission: I have personally reviewed following labs and imaging studies  CBC: Recent Labs  Lab 04/21/18 0501  WBC 5.4  HGB 11.9*  HCT 34.6*  MCV 78.8*  PLT 154   Basic Metabolic Panel: Recent Labs  Lab 04/21/18 0501  NA 127*  K 3.4*  CL 96*  CO2 20*  GLUCOSE 797*  BUN 14  CREATININE 1.48*  CALCIUM 8.8*   Liver Function Tests: Recent Labs  Lab 04/21/18 0501  AST 12*  ALT 14  ALKPHOS 82  BILITOT 0.4  PROT 6.0*  ALBUMIN 3.3*   Cardiac Enzymes: Recent Labs  Lab 04/21/18 0501  TROPONINI <0.03   CBG: Recent Labs  Lab 04/21/18 0446 04/21/18 0639  GLUCAP >600* >600*   Urine analysis:    Component Value Date/Time   COLORURINE COLORLESS (A) 04/21/2018 0501   APPEARANCEUR CLEAR 04/21/2018 0501   LABSPEC 1.022 04/21/2018 0501   PHURINE 6.0 04/21/2018 0501   GLUCOSEU >=  500 (A) 04/21/2018 0501   GLUCOSEU 250 (A) 03/17/2018 0927   HGBUR NEGATIVE 04/21/2018 0501   BILIRUBINUR NEGATIVE 04/21/2018 0501   KETONESUR NEGATIVE 04/21/2018 0501   PROTEINUR NEGATIVE 04/21/2018 0501   UROBILINOGEN 0.2 03/17/2018 0927   NITRITE NEGATIVE 04/21/2018 0501   LEUKOCYTESUR NEGATIVE 04/21/2018 0501   Radiological Exams on Admission: Dg Chest 2 View  Result Date: 04/21/2018 CLINICAL DATA:  Sudden onset of mid chest pain EXAM: CHEST - 2 VIEW COMPARISON:  12/13/2016 FINDINGS: Normal heart size. Negative aortic and hilar contours. There is no edema, consolidation, effusion, or pneumothorax. Small nodular density at the left base most likely reflecting nipple shadow, also seen previously. IMPRESSION: No evidence of active disease. Electronically Signed   By: Marnee Spring M.D.   On: 04/21/2018 05:58   EKG: Independently reviewed. NSR w/normal axis, normal intervals, narrow QRS. Mild J point elevation in septal leads, diffuse nonspecific T  changes.   Assessment/Plan Active Problems:   DKA (diabetic ketoacidoses) (HCC)   HHNK, uncontrolled T2DM with hyperglycemia: Recent Hgb A1c 13.3% (avg glucose ~335mg /dl), in setting of nonadherence to insulin and other medications.  - Given 2L NS, continue aggressive rehydration, insulin gtt - CM and diabetes coordinator consults placed.  Chest pain, ECG changes in patient with CAD: Initial troponin negative.  - Serial cardiac enzymes - Continue telemetry - Monitor ECG - Will consult cardiology for recommendations and to help establish care. - Continue atenolol, crestor, ASA  HTN:  - Continue atenolol, lisinopril as creatinine is at baseline.   Microcytic anemia: Negative FOBT, no gross bleeding per patient. Has not had a colonoscopy per family and no records showing procedure. - Check anemia panel - Needs colonoscopy as outpatient.  Diabetic neuropathy:  - Continue gabapentin.  Hypokalemia:  - Replace and monitor, expect worsening w/glucose correction  Pseudohyponatremia: Na corrects to 138.  Tobacco use: Very intermittent, "once in a blue moon."  - Will need ongoing cessation counseling  DVT prophylaxis: Lovenox  Code Status: Full  Family Communication: Wife by phone Disposition Plan: Home once stabilized Consults called: Cardiology  Admission status: Observation    Tyrone Nine, MD [facility] [email or web address] Password [facility] 04/21/2018, 7:45 AM

## 2018-04-21 NOTE — Progress Notes (Signed)
  Echocardiogram 2D Echocardiogram has been performed.  John Dalton 04/21/2018, 12:29 PM

## 2018-04-21 NOTE — Consult Note (Addendum)
Cardiology Consultation:   Patient ID: John Dalton; [id number]; August 14, 1960   Admit date: 04/21/2018 Date of Consult: 04/21/2018  Primary Care Provider: Corwin Levins, MD Primary Cardiologist: New to [facility], Dr. Eden Emms   Patient Profile:   John Dalton is a 58 y.o. male with a hx of CAD s/p MI 2006, HTN, HLD and DM 2 who is being seen today for the evaluation of chest pain at the request of Dr. Karle Plumber  History of Present Illness:   John Dalton is a 58 year old male with a history stated above who presented to [facility] on 04/21/2018 with complaints of acute onset of chest pain yesterday evening without associated diaphoresis, SOB, nausea or vomiting. He described the pain as sharp and  severe with radiation to his left arm. He reports similar symptoms with his prior MI.  He has had no recent symptoms of orthopnea, lower extremity swelling or PND. He was in his usual state of health until this episode. He does state that he has had prior episodes over the last month which were described as less severe. He states that he has not been taking his medicaiotns as prescribed. His MI was while living in [place] and has not folowed with cardiology since that time. Prior to ED arrival he took 1 ASA been called EMS for transport to the emergency department for further evaluation.  In transit, EMS administered SL NTG though chest pain had resolved at that time.  In the ED, he was noted to be hypertensive.  Initial i-STAT troponin was negative at <0.03.  EKG with diffuse T wave inversion in the inferior lateral leads. CXR negative for acute cardiopulmonary disease. CBG on presentation found to be 797 found to be in DKA. Creatinine is abnormal but near his baseline at 1.48.  Cardiology was asked to evaluate given presenting symptoms.    Past Medical History:  Diagnosis Date  . Allergy   . Diabetes mellitus (HCC)   . Diverticulitis   . Environmental and seasonal allergies   . 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 HTN (hypertension)   . Hypercholesteremia   . Neuropathy 01/11/2017    Past Surgical History:  Procedure Laterality Date  . Colon resection  2006   due to diverticulitis     Prior to Admission medications   Medication Sig Start Date End Date Taking? Authorizing Provider  aspirin EC 81 MG tablet Take 81 mg by mouth daily.   Yes [provider]  atenolol (TENORMIN) 50 MG tablet Take 1 tablet (50 mg total) by mouth daily. 12/15/17  Yes Corwin Levins, MD  gabapentin (NEURONTIN) 600 MG tablet Take 2 tablets (1,200 mg total) by mouth 3 (three) times daily. 03/26/18  Yes Corwin Levins, MD  lisinopril (PRINIVIL,ZESTRIL) 40 MG tablet Take 1 tablet (40 mg total) by mouth daily. 12/15/17  Yes Corwin Levins, MD  Multiple Vitamins-Minerals (CENTRUM ADULTS PO) Take 1 tablet daily by mouth.   Yes [provider]  rosuvastatin (CRESTOR) 40 MG tablet Take 1 tablet (40 mg total) by mouth daily. 12/15/17  Yes Corwin Levins, MD  cyclobenzaprine (FLEXERIL) 5 MG tablet Take 1 tablet (5 mg total) by mouth 3 (three) times daily as needed for muscle spasms. Patient not taking: Reported on 04/21/2018 12/15/17   Corwin Levins, MD  insulin NPH-regular Human (NOVOLIN 70/30 RELION) (70-30) 100 UNIT/ML injection INJECT 75 UNITS SUBCUTANEOUSLY TWICE DAILY WITH A MEAls  12/15/17   Corwin Levins, MD  Insulin Syringe-Needle U-100 (BD INSULIN SYRINGE ULTRAFINE) 31G X 15/64" 0.5 ML MISC Use 1 needle per injection to inject insulin 1-4 times daily as instructed. 06/13/17   Corwin Levins, MD    Inpatient Medications: Scheduled Meds: . aspirin EC  81 mg Oral Daily  . atenolol  50 mg Oral Daily  . enoxaparin (LOVENOX) injection  40 mg Subcutaneous Q24H  . gabapentin  600 mg Oral TID  . lisinopril  40 mg Oral Daily  . rosuvastatin  40 mg Oral Daily  . sodium chloride flush  3 mL Intravenous Q12H   Continuous Infusions: . sodium chloride    . sodium chloride 110 mL/hr  at 04/21/18 0656  . dextrose 5 % and 0.45% NaCl    . dextrose 5 % and 0.45% NaCl    . insulin 5.4 Units/hr (04/21/18 0705)   PRN Meds: acetaminophen **OR** acetaminophen, ondansetron **OR** ondansetron (ZOFRAN) IV  Allergies:    Allergies  Allergen Reactions  . Metformin And Related Diarrhea    Social History:   Social History   Socioeconomic History  . Marital status: Married    Spouse name: Not on file  . Number of children: 9  . Years of education: 40  . Highest education level: Not on file  Occupational History  . Occupation: Retired  Engineer, production  . Financial resource strain: Not on file  . Food insecurity:    Worry: Not on file    Inability: Not on file  . Transportation needs:    Medical: Not on file    Non-medical: Not on file  Tobacco Use  . Smoking status: Current Some Day Smoker    Packs/day: 0.00    Years: 20.00    Pack years: 0.00    Types: Cigarettes  . Smokeless tobacco: Never Used  . Tobacco comment: denies 04/21/2018  Substance and Sexual Activity  . Alcohol use: No    Alcohol/week: 0.0 standard drinks  . Drug use: No  . Sexual activity: Yes    Partners: Female  Lifestyle  . Physical activity:    Days per week: Not on file    Minutes per session: Not on file  . Stress: Not on file  Relationships  . Social connections:    Talks on phone: Not on file    Gets together: Not on file    Attends religious service: Not on file    Active member of club or organization: Not on file    Attends meetings of clubs or organizations: Not on file    Relationship status: Not on file  . Intimate partner violence:    Fear of current or ex partner: Not on file    Emotionally abused: Not on file    Physically abused: Not on file    Forced sexual activity: Not on file  Other Topics Concern  . Not on file  Social History Narrative   Fun: Sports, reading and walking    Family History:   Family History  Problem Relation Age of Onset  . Diabetes Father    . Hypertension Mother   . Hypertension Sister   . Diabetes Paternal Grandmother   . Hypertension Paternal Grandmother   . Diabetes Paternal Grandfather   . Hypertension Paternal Grandfather   . Hypertension Maternal Grandmother   . Hypertension Maternal Grandfather    Family Status:  Family Status  Relation Name Status  . Father  (Not Specified)  . Mother  (  Not Specified)  . Sister  (Not Specified)  . PGM  (Not Specified)  . PGF  (Not Specified)  . MGM  (Not Specified)  . MGF  (Not Specified)    ROS:  Please see the history of present illness.  All other ROS reviewed and negative.     Physical Exam/Data:   Vitals:   04/21/18 0600 04/21/18 0630 04/21/18 0700 04/21/18 0900  BP: (!) 152/67 (!) 154/75 (!) 160/84   Pulse:      Resp: Temp:    98.9 F (37.2 C)  TempSrc:    Oral  SpO2:      Weight:      Height:        Intake/Output Summary (Last 24 hours) at 04/21/2018 0918 Last data filed at 04/21/2018 0654 Gross per 24 hour  Intake 2000 ml  Output -  Net 2000 ml   Filed Weights   04/21/18 0448 04/21/18 0508  Weight: 90.7 kg 93 kg   Body mass index is 29.41 kg/m.   General: Well developed, well nourished, NAD Skin: Warm, dry, intact  Head: Normocephalic, atraumatic, sclera non-icteric, no xanthomas, clear, moist mucus membranes. Neck: Negative for carotid bruits. No JVD Lungs:Clear to ausculation bilaterally. No wheezes, rales, or rhonchi. Breathing is unlabored. Cardiovascular: RRR with S1 S2. No murmurs, rubs, gallops, or LV heave appreciated. Abdomen: Soft, non-tender, non-distended with normoactive bowel sounds. No obvious abdominal masses. MSK: Strength and tone appear normal for age. 5/5 in all extremities Extremities: No edema. No clubbing or cyanosis. DP/PT pulses 2+ bilaterally Neuro: Alert and oriented. No focal deficits. No facial asymmetry. MAE spontaneously. Psych: Responds to questions appropriately with normal affect.     EKG:  The  EKG was personally reviewed and demonstrates:  04/21/2018 NSR with T wave inversions in inferior and lateral leads, changed from prior EKG Telemetry:  Telemetry was personally reviewed and demonstrates: NSR  Relevant CV Studies:  ECHO: None   CATH: Not found in EPIC  Laboratory Data:  Chemistry Recent Labs  Lab 04/21/18 0501  NA 127*  K 3.4*  CL 96*  CO2 20*  GLUCOSE 797*  BUN 14  CREATININE 1.48*  CALCIUM 8.8*  GFRNONAA 51*  GFRAA 60*  ANIONGAP 11    Total Protein  Date Value Ref Range Status  04/21/2018 6.0 (L) 6.5 - 8.1 g/dL Final   Albumin  Date Value Ref Range Status  04/21/2018 3.3 (L) 3.5 - 5.0 g/dL Final   AST  Date Value Ref Range Status  04/21/2018 12 (L) 15 - 41 U/L Final   ALT  Date Value Ref Range Status  04/21/2018 14 0 - 44 U/L Final   Alkaline Phosphatase  Date Value Ref Range Status  04/21/2018 82 38 - 126 U/L Final   Total Bilirubin  Date Value Ref Range Status  04/21/2018 0.4 0.3 - 1.2 mg/dL Final   Hematology Recent Labs  Lab 04/21/18 0501  WBC 5.4  RBC 4.39  HGB 11.9*  HCT 34.6*  MCV 78.8*  MCH 27.1  MCHC 34.4  RDW 13.6  PLT 154   Cardiac Enzymes Recent Labs  Lab 04/21/18 0501  TROPONINI <0.03   No results for input(s): TROPIPOC in the last 168 hours.  BNPNo results for input(s): BNP, PROBNP in the last 168 hours.  DDimer No results for input(s): DDIMER in the last 168 hours. TSH:  Lab Results  Component Value Date   TSH 1.43 03/17/2018   Lipids: Lab Results  Component Value Date   CHOL 214 (H) 03/17/2018   HDL 26.30 (L) 03/17/2018   LDLCALC 158 (H) 03/17/2018   LDLDIRECT 60.0 04/10/2017   TRIG 145.0 03/17/2018   CHOLHDL 8 03/17/2018   HgbA1c: Lab Results  Component Value Date   HGBA1C 13.3 (H) 03/17/2018    Radiology/Studies:  Dg Chest 2 View  Result Date: 04/21/2018 CLINICAL DATA:  Sudden onset of mid chest pain EXAM: CHEST - 2 VIEW COMPARISON:  12/13/2016 FINDINGS: Normal heart size. Negative  aortic and hilar contours. There is no edema, consolidation, effusion, or pneumothorax. Small nodular density at the left base most likely reflecting nipple shadow, also seen previously. IMPRESSION: No evidence of active disease. Electronically Signed   By: Marnee Spring M.D.   On: 04/21/2018 05:58   Assessment and Plan:   1.  Chest pain with EKG changes and known CAD: -Patient presented with acute episode of chest pain with no associated symptoms. Pt reports one month hx of less severe episodes. Reports MI in 2006 with no intervention per pt -EKG with T wave inversions in inferior and lateral leads, changed from prior tracing  -Troponin, negative, continue to trend  -Will obtain echocardiogram and place on cath board for this Thursday 04/23/2018 for further ischemic evaluation.  -DKA will need to be more stable prior to cath  -Denies recurrent  Chest pain today  -Continue ASA, statin, BB -Will change atenolol to carvedilol -Continue lisinopril  -Will obtain lipid panel and HbA1c  -The risks and benefits of a cardiac catheterization including, but not limited to, death, stroke, MI, kidney damage and bleeding were discussed with the patient who indicates understanding and agrees to proceed.   2.  HTN: -Elevated, 173/75>160/84>154/75 -Continue lisinopril -Will change atenolol to carvedilol  -Creatinine at baseline although elevated  3.  DM2/DKA: -On insulin gtt -Uncontrolled at baseline>>presenting glucose >700 -Per primary team    For questions or updates, please contact [facility] Please consult [email or web address] for contact info under Cardiology/STEMI.   Raliegh Ip NP-C [facility] Pager: [phone] 04/21/2018 9:18 AM  Patient examined chart reviewed. Exam with normal heart sounds clear lungs soft abdomen no edema and good Right radial pulse. Troponin negative Worrisome chest pain and ECG changes in setting of medical non compliance and DKA. Previous history of MI  no intervention ? Small vessel diabetic disease. Discussed options with patient Risks of cath including bleeding , MI, contrast reaction stroke and need for emergency surgery discussed Will schedule cath for Thursday as DKA needs to be Rx with better BS control. Follow lytes, add beta blocker and continue ACE for HTN  Charlton Haws

## 2018-04-21 NOTE — ED Notes (Signed)
Attempted report x1. 

## 2018-04-21 NOTE — Progress Notes (Signed)
Inpatient Diabetes Program Recommendations  AACE/ADA: New Consensus Statement on Inpatient Glycemic Control (2015)  Target Ranges:  Prepandial:   less than 140 mg/dL      Peak postprandial:   less than 180 mg/dL (1-2 hours)      Critically ill patients:  140 - 180 mg/dL   Lab Results  Component Value Date   GLUCAP 290 (H) 04/21/2018   HGBA1C 13.3 (H) 04/21/2018    Review of Glycemic Control  Diabetes history: DM2 Outpatient Diabetes medications: 70/30 75 units bid  Current orders for Inpatient glycemic control: Levemir 30 units bid, Novolog 0-20 units tidwc and hs + 8 units tidwc  HgbA1C - 13.3%  Inpatient Diabetes Program Recommendations:     Agree with increasing Levemir to 30 units bid Increase Novolog to 12 units tidwc for meal coverage insulin. Titrate Levemir until FBS < 180 mg/dL.  Pt in Echo today - will f/u in am to discuss HgbA1C and lifestyle modifications to control his blood sugars. May benefit from OP Diabetes Education for uncontrolled DM.  Continue to follow.  Thank you. Ailene Ards, RD, LDN, CDE Inpatient Diabetes Coordinator [phone]

## 2018-04-21 NOTE — ED Triage Notes (Signed)
Patient arrived with EMS from home c/o Chest Pain. EMS stated patient initially reported chest pain but patient CBG was 543. Pt stated he run out of insulin for a month. Pt had 1 nitroglycerin and 324 aspirin. Pt no complain of pain at this time. Pt a/ox4

## 2018-04-21 NOTE — ED Notes (Signed)
ED TO INPATIENT HANDOFF REPORT  ED Nurse Name and Phone #: [phone]  S Name/Age/Gender John Dalton 58 y.o. male Room/Bed: WA19/WA19  Code Status   Code Status: Not on file  Home/SNF/Other Home Patient oriented to: situation Is this baseline? Yes   Triage Complete: Triage complete  Chief Complaint Hyperglycemia;Chest Pain  Triage Note Patient arrived with EMS from home c/o Chest Pain. EMS stated patient initially reported chest pain but patient CBG was 543. Pt stated he run out of insulin for a month. Pt had 1 nitroglycerin and 324 aspirin. Pt no complain of pain at this time. Pt a/ox4   Allergies Allergies  Allergen Reactions  . Metformin And Related Diarrhea    Level of Care/Admitting Diagnosis ED Disposition    ED Disposition Condition Comment   Admit  Hospital Area: [facility] [[id number]]  Level of Care: Stepdown [14]  Admit to SDU based on following criteria: Severe physiological/psychological symptoms:  Any diagnosis requiring assessment & intervention at least every 4 hours on an ongoing basis to obtain desired patient outcomes including stability and rehabilitation  Diagnosis: DKA (diabetic ketoacidoses) [facility]) [[id number]]  Admitting Physician: Briscoe Deutscher [[id number]]  Attending Physician: Briscoe Deutscher [[id number]]  PT Class (Do Not Modify): Observation [104]  PT Acc Code (Do Not Modify): Observation [10022]       B Medical/Surgery History Past Medical History:  Diagnosis Date  . Allergy   . Diabetes mellitus (HCC)   . Diverticulitis   . Environmental and seasonal allergies   . 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 HTN (hypertension)   . Hypercholesteremia   . Neuropathy 01/11/2017   Past Surgical History:  Procedure Laterality Date  . Colon resection       A IV Location/Drains/Wounds Patient Lines/Drains/Airways Status   Active Line/Drains/Airways    Name:   Placement date:    Placement time:   Site:   Days:   Peripheral IV 04/21/18 Left Hand   04/21/18    0459    Hand   less than 1          Intake/Output Last 24 hours  Intake/Output Summary (Last 24 hours) at 04/21/2018 0735 Last data filed at 04/21/2018 0654 Gross per 24 hour  Intake 2000 ml  Output -  Net 2000 ml    Labs/Imaging Results for orders placed or performed during the hospital encounter of 04/21/18 (from the past 48 hour(s))  CBG monitoring, ED     Status: Abnormal   Collection Time: 04/21/18  4:46 AM  Result Value Ref Range   Glucose-Capillary >600 (HH) 70 - 99 mg/dL   Comment 1 Notify RN    Comment 2 Document in Chart   Comprehensive metabolic panel     Status: Abnormal   Collection Time: 04/21/18  5:01 AM  Result Value Ref Range   Sodium 127 (L) 135 - 145 mmol/L   Potassium 3.4 (L) 3.5 - 5.1 mmol/L   Chloride 96 (L) 98 - 111 mmol/L   CO2 20 (L) 22 - 32 mmol/L   Glucose, Bld 797 (HH) 70 - 99 mg/dL    Comment: CRITICAL RESULT CALLED TO, READ BACK BY AND VERIFIED WITH: RN DAVY AT 0536 04/21/18 CRUICKSHANK A    BUN 14 6 - 20 mg/dL   Creatinine, Ser 6.86 (H) 0.61 - 1.24 mg/dL   Calcium 8.8 (L) 8.9 - 10.3 mg/dL   Total Protein 6.0 (L) 6.5 -  8.1 g/dL   Albumin 3.3 (L) 3.5 - 5.0 g/dL   AST 12 (L) 15 - 41 U/L   ALT 14 0 - 44 U/L   Alkaline Phosphatase 82 38 - 126 U/L   Total Bilirubin 0.4 0.3 - 1.2 mg/dL   GFR calc non Af Amer 51 (L) >60 mL/min   GFR calc Af Amer 60 (L) >60 mL/min   Anion gap 11 5 - 15    Comment: Performed at [facility], [address], [place], [place] [address]  CBC     Status: Abnormal   Collection Time: 04/21/18  5:01 AM  Result Value Ref Range   WBC 5.4 4.0 - 10.5 K/uL   RBC 4.39 4.22 - 5.81 MIL/uL   Hemoglobin 11.9 (L) 13.0 - 17.0 g/dL   HCT 60.4 (L) 54.0 - 98.1 %   MCV 78.8 (L) 80.0 - 100.0 fL   MCH 27.1 26.0 - 34.0 pg   MCHC 34.4 30.0 - 36.0 g/dL   RDW 19.1 47.8 - 29.5 %   Platelets 154 150 - 400 K/uL   nRBC 0.0 0.0 - 0.2 %     Comment: Performed at [facility], [address], [place], [place] [address]  Urinalysis, Routine w reflex microscopic     Status: Abnormal   Collection Time: 04/21/18  5:01 AM  Result Value Ref Range   Color, Urine COLORLESS (A) YELLOW   APPearance CLEAR CLEAR   Specific Gravity, Urine 1.022 1.005 - 1.030   pH 6.0 5.0 - 8.0   Glucose, UA >=500 (A) NEGATIVE mg/dL   Hgb urine dipstick NEGATIVE NEGATIVE   Bilirubin Urine NEGATIVE NEGATIVE   Ketones, ur NEGATIVE NEGATIVE mg/dL   Protein, ur NEGATIVE NEGATIVE mg/dL   Nitrite NEGATIVE NEGATIVE   Leukocytes,Ua NEGATIVE NEGATIVE   WBC, UA 0-5 0 - 5 WBC/hpf   Bacteria, UA NONE SEEN NONE SEEN    Comment: Performed at [facility], [address], [place], [place] [address]  Troponin I - ONCE - STAT     Status: None   Collection Time: 04/21/18  5:01 AM  Result Value Ref Range   Troponin I <0.03 <0.03 ng/mL    Comment: Performed at [facility], [address] [place], [place] [address]  POC occult blood, ED Provider will collect     Status: None   Collection Time: 04/21/18  6:11 AM  Result Value Ref Range   Fecal Occult Bld NEGATIVE NEGATIVE  CBG monitoring, ED     Status: Abnormal   Collection Time: 04/21/18  6:39 AM  Result Value Ref Range   Glucose-Capillary >600 (HH) 70 - 99 mg/dL   Dg Chest 2 View  Result Date: 04/21/2018 CLINICAL DATA:  Sudden onset of mid chest pain EXAM: CHEST - 2 VIEW COMPARISON:  12/13/2016 FINDINGS: Normal heart size. Negative aortic and hilar contours. There is no edema, consolidation, effusion, or pneumothorax. Small nodular density at the left base most likely reflecting nipple shadow, also seen previously. IMPRESSION: No evidence of active disease. Electronically Signed   By: Marnee Spring M.D.   On: 04/21/2018 05:58    Pending Labs Unresulted Labs (From admission, onward)    Start     Ordered   04/21/18 1100  Troponin I - Now Then Q6H  Now  then every 6 hours,   R     04/21/18 0729   04/21/18 1000  Basic metabolic panel  Now then every 4  hours,   STAT     04/21/18 0628          Vitals/Pain Today's Vitals   04/21/18 0552 04/21/18 0600 04/21/18 0630 04/21/18 0700  BP:  (!) 152/67 (!) 154/75 (!) 160/84  Pulse: 63     Resp: 13 15 11 11   Temp:      TempSrc:      SpO2: 97%     Weight:      Height:      PainSc:        Isolation Precautions No active isolations  Medications Medications  dextrose 5 %-0.45 % sodium chloride infusion (has no administration in time range)  0.9 %  sodium chloride infusion (has no administration in time range)  0.9 %  sodium chloride infusion ( Intravenous New Bag/Given 04/21/18 0656)  dextrose 5 %-0.45 % sodium chloride infusion (has no administration in time range)  insulin regular, human (MYXREDLIN) 100 units/ 100 mL infusion (5.4 Units/hr Intravenous New Bag/Given 04/21/18 0705)  potassium chloride 10 mEq in 100 mL IVPB (10 mEq Intravenous New Bag/Given 04/21/18 0658)  sodium chloride 0.9 % bolus 1,000 mL (0 mLs Intravenous Stopped 04/21/18 0542)  sodium chloride 0.9 % bolus 1,000 mL (0 mLs Intravenous Stopped 04/21/18 0654)    Mobility walks Low fall risk   Focused Assessments Cardiac Assessment Handoff:    Lab Results  Component Value Date   TROPONINI <0.03 04/21/2018   No results found for: DDIMER Does the Patient currently have chest pain? No     R Recommendations: See Admitting Provider Note  Report given to:   Additional Notes:

## 2018-04-21 NOTE — ED Provider Notes (Signed)
[facility]-EMERGENCY DEPT Provider Note   CSN: [id number] Arrival date & time: 04/21/18  [id number]    History   Chief Complaint Chief Complaint  Patient presents with   Chest Pain   Hyperglycemia    HPI John Dalton is a 58 y.o. male with a history of CAD s/p MI in 2006, HTN, HLD, and diabetes mellitus type 2, and CKD stage III who presents to the emergency department with a chief complaint of chest pain.  The patient endorses left-sided chest pain that radiated down his left arm that lasted for approximately 1 minute before resolving.  Reports he was sitting on his couch when the pain began. He characterizes the pain as sharp.  He denies radiation to his neck or back.  No known aggravating or alleviating factors.    He denies associated diaphoresis, shortness of breath, palpitations, cough, leg swelling, orthopnea, nausea, vomiting, diarrhea, abdominal pain, back pain, dizziness, lightheadedness, headache, or visual changes.    He reports that he has been having intermittent chest pain over the last few weeks, but is unsure if the episodes have been occurring with exertion or at rest because "they've just been little pains."  He states that the episode tonight was severe in intensity and reminded him of when he had his MI 20 years ago.  He did not have bypass surgery or cardiac stents placed.  He does not see cardiology.  He reports that the pain resolved around the time that he took a 325 mg ASA at home.  He reports that he was given sublingual nitroglycerin and ASA by EMS.  The patient reports that he has been out of most of his home medications for the last month.  He reports that he has been checking his blood sugar with his glucometer at home. The glucometer has read HIGH each time he has checked it for the last 2 weeks.  He reports associated polydipsia and polyuria.  He reports that he has been non-compliant due to the cost of his medications.  He denies any  IV or recreational drug use, including crack cocaine, and no recent EtOH use.  He reports that he has been out of all of his home medications except for atenolol and rosuvastatin.   He reports he was treated for his MI in 2006 at [facility] in [place].     The history is provided by the patient and the EMS personnel. No language interpreter was used.    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)   HTN (hypertension)    Hypercholesteremia    Neuropathy 01/11/2017    Patient Active Problem List   Diagnosis Date Noted   DKA (diabetic ketoacidoses) (HCC) 04/21/2018   CKD (chronic kidney disease) stage 3, GFR 30-59 ml/min (HCC) 03/26/2018   Alopecia 06/13/2017   Neuropathy 01/11/2017   Erectile dysfunction 12/29/2015   Abscess of back 12/11/2015   Heart palpitations 09/25/2015   Stye external 09/25/2015   Type 2 diabetes mellitus (HCC) 08/16/2015   Preventative health care 08/16/2015   Medicare annual wellness visit, subsequent 08/16/2015   Hypertension, essential 08/16/2015   Hyperlipidemia 08/16/2015   Obesity 08/16/2015   Impacted cerumen of both ears 08/16/2015    Past Surgical History:  Procedure Laterality Date  Colon resection          Home Medications    Prior to Admission medications   Medication Sig Start Date End Date Taking? Authorizing Provider  aspirin EC 81 MG tablet Take 81 mg by mouth daily.   Yes [provider]  atenolol (TENORMIN) 50 MG tablet Take 1 tablet (50 mg total) by mouth daily. 12/15/17  Yes Corwin Levins, MD  gabapentin (NEURONTIN) 600 MG tablet Take 2 tablets (1,200 mg total) by mouth 3 (three) times daily. 03/26/18  Yes Corwin Levins, MD  insulin NPH-regular Human (NOVOLIN 70/30 RELION) (70-30) 100 UNIT/ML injection INJECT 75  UNITS SUBCUTANEOUSLY TWICE DAILY WITH A MEAls 12/15/17  Yes Corwin Levins, MD  lisinopril (PRINIVIL,ZESTRIL) 40 MG tablet Take 1 tablet (40 mg total) by mouth daily. 12/15/17  Yes Corwin Levins, MD  Multiple Vitamins-Minerals (CENTRUM ADULTS PO) Take 1 tablet daily by mouth.   Yes [provider]  rosuvastatin (CRESTOR) 40 MG tablet Take 1 tablet (40 mg total) by mouth daily. 12/15/17  Yes Corwin Levins, MD  cyclobenzaprine (FLEXERIL) 5 MG tablet Take 1 tablet (5 mg total) by mouth 3 (three) times daily as needed for muscle spasms. Patient not taking: Reported on 04/21/2018 12/15/17   Corwin Levins, MD  Insulin Syringe-Needle U-100 (BD INSULIN SYRINGE ULTRAFINE) 31G X 15/64" 0.5 ML MISC Use 1 needle per injection to inject insulin 1-4 times daily as instructed. 06/13/17   Corwin Levins, MD    Family History Family History  Problem Relation Age of Onset   Diabetes Father    Hypertension Mother    Hypertension Sister    Diabetes Paternal Grandmother    Hypertension Paternal Grandmother    Diabetes Paternal Grandfather    Hypertension Paternal Grandfather    Hypertension Maternal Grandmother    Hypertension Maternal Grandfather     Social History Social History   Tobacco Use   Smoking status: Current Some Day Smoker    Packs/day: 0.00    Years: 20.00    Pack years: 0.00    Types: Cigarettes   Smokeless tobacco: Never Used  Substance Use Topics   Alcohol use: No    Alcohol/week: 0.0 standard drinks   Drug use: No     Allergies   Metformin and related   Review of Systems Review of Systems  Constitutional: Negative for appetite change, chills and fever.  HENT: Negative for congestion.   Respiratory: Negative for cough, shortness of breath and wheezing.   Cardiovascular: Positive for chest pain. Negative for palpitations and leg swelling.  Gastrointestinal: Negative for abdominal pain, diarrhea, nausea and vomiting.  Endocrine: Positive for polydipsia  and polyuria.  Genitourinary: Negative for dysuria and hematuria.  Musculoskeletal: Negative for back pain, myalgias, neck pain and neck stiffness.  Skin: Negative for rash.  Allergic/Immunologic: Negative for immunocompromised state.  Neurological: Negative for dizziness, syncope, weakness, numbness and headaches.  Psychiatric/Behavioral: Negative for confusion.   Physical Exam Updated Vital Signs BP (!) 152/67    Pulse 63    Temp 98.3 F (36.8 C) (Oral)    Resp 15    Ht 5\' 10"  (1.778 m)    Wt 93 kg    SpO2 97%    BMI 29.41 kg/m   Physical Exam Vitals signs and nursing note reviewed.  Constitutional:      Appearance: He is well-developed. He is not diaphoretic.  HENT:     Head: Normocephalic.     Mouth/Throat:  Mouth: Mucous membranes are moist.  Eyes:     General: No scleral icterus.    Extraocular Movements: Extraocular movements intact.     Conjunctiva/sclera: Conjunctivae normal.     Pupils: Pupils are equal, round, and reactive to light.  Neck:     Musculoskeletal: Normal range of motion and neck supple.  Cardiovascular:     Rate and Rhythm: Normal rate and regular rhythm.     Pulses:          Radial pulses are 2+ on the right side and 2+ on the left side.       Dorsalis pedis pulses are 2+ on the right side and 2+ on the left side.     Heart sounds: No murmur. No friction rub. No gallop.   Pulmonary:     Effort: Pulmonary effort is normal. No respiratory distress.     Breath sounds: No stridor. No wheezing, rhonchi or rales.     Comments: No reproducible tenderness to the chest wall with palpation. Chest:     Chest wall: No tenderness.  Abdominal:     General: There is no distension.     Palpations: Abdomen is soft. There is no mass.     Tenderness: There is no abdominal tenderness. There is no right CVA tenderness, left CVA tenderness, guarding or rebound.     Hernia: No hernia is present.  Genitourinary:    Rectum: Guaiac result negative. No tenderness or  external hemorrhoid. Normal anal tone.  Musculoskeletal:        General: No swelling or tenderness.     Right lower leg: No edema.     Left lower leg: No edema.     Comments: No tenderness to the cervical, thoracic, lumbar spinous processes or bilateral paraspinal muscles.  Skin:    General: Skin is warm and dry.     Capillary Refill: Capillary refill takes less than 2 seconds.     Coloration: Skin is not jaundiced.     Findings: No bruising.  Neurological:     Mental Status: He is alert.  Psychiatric:        Behavior: Behavior normal.      ED Treatments / Results  Labs (all labs ordered are listed, but only abnormal results are displayed) Labs Reviewed  COMPREHENSIVE METABOLIC PANEL - Abnormal; Notable for the following components:      Result Value   Sodium 127 (*)    Potassium 3.4 (*)    Chloride 96 (*)    CO2 20 (*)    Glucose, Bld 797 (*)    Creatinine, Ser 1.48 (*)    Calcium 8.8 (*)    Total Protein 6.0 (*)    Albumin 3.3 (*)    AST 12 (*)    GFR calc non Af Amer 51 (*)    GFR calc Af Amer 60 (*)    All other components within normal limits  CBC - Abnormal; Notable for the following components:   Hemoglobin 11.9 (*)    HCT 34.6 (*)    MCV 78.8 (*)    All other components within normal limits  URINALYSIS, ROUTINE W REFLEX MICROSCOPIC - Abnormal; Notable for the following components:   Color, Urine COLORLESS (*)    Glucose, UA >=500 (*)    All other components within normal limits  CBG MONITORING, ED - Abnormal; Notable for the following components:   Glucose-Capillary >600 (*)    All other components within normal limits  TROPONIN I  POC OCCULT BLOOD, ED    EKG EKG Interpretation  Date/Time:  Tuesday April 21 2018 04:48:19 EDT Ventricular Rate:  68 PR Interval:    QRS Duration: 90 QT Interval:  405 QTC Calculation: 431 R Axis:   -28 Text Interpretation:  Sinus rhythm Borderline left axis deviation Nonspecific T abnormalities, diffuse leads When  compared with ECG of 12/13/2016, Nonspecific T wave abnormality is slightly more prominent Confirmed by Dione Booze ([id number]) on 04/21/2018 4:52:21 AM   Radiology Dg Chest 2 View  Result Date: 04/21/2018 CLINICAL DATA:  Sudden onset of mid chest pain EXAM: CHEST - 2 VIEW COMPARISON:  12/13/2016 FINDINGS: Normal heart size. Negative aortic and hilar contours. There is no edema, consolidation, effusion, or pneumothorax. Small nodular density at the left base most likely reflecting nipple shadow, also seen previously. IMPRESSION: No evidence of active disease. Electronically Signed   By: Marnee Spring M.D.   On: 04/21/2018 05:58    Procedures Procedures (including critical care time)  Medications Ordered in ED Medications  dextrose 5 %-0.45 % sodium chloride infusion (has no administration in time range)  insulin regular, human (MYXREDLIN) 100 units/ 100 mL infusion (has no administration in time range)  0.9 %  sodium chloride infusion (has no administration in time range)  sodium chloride 0.9 % bolus 1,000 mL (0 mLs Intravenous Stopped 04/21/18 0542)  sodium chloride 0.9 % bolus 1,000 mL (1,000 mLs Intravenous New Bag/Given 04/21/18 0553)     Initial Impression / Assessment and Plan / ED Course  I have reviewed the triage vital signs and the nursing notes.  Pertinent labs & imaging results that were available during my care of the patient were reviewed by me and considered in my medical decision making (see chart for details).  Clinical Course as of Apr 20 625  Tue Apr 21, 2018  0519 When leaning forward, the patient appears to take his time pulling himself forward with his bilateral upper extremities and looks uncomfortable.  When asked about this, he denies back pain.  He states that EMS asked him the same.  No recent falls or injuries.  No new exercises.   [MM]    Clinical Course User Index [MM] Barkley Boards, PA-C       58 year old male with a history of CAD s/p MI in 2006,  HTN, HLD, and diabetes mellitus type 2, and CKD stage III presenting from home with sharp left-sided chest pain that radiated down the left arm that lasted for approximately 1 minute.  He reports he has been having similar episodes of pain for some time, but the episode tonight was much more severe in intensity.  The patient was seen and dependently evaluated by Dr. Preston Fleeting, attending physician.  EKG with nonspecific T wave changes.  Initial troponin is negative.  Chest x-ray is unremarkable.  HEART pathway with score of 4.   Labs are otherwise notable for decrease in hemoglobin to 11.9, down from 15.  He is not actively bleeding and denies melena or hematochezia.  Hemoccult is negative.   Glucose is elevated at 797 with a bicarb of 20.  However, anion gap is 11.  Sodium is decreased to 127 likely secondary to hyperglycemia.  BUN has decreased from previous and is 1.48.  He does not appear dehydrated and BUN to creatinine is less than 20-1.  Urinalysis positive for glucosuria, but otherwise unremarkable.  After discussing the patient with Dr. Preston Fleeting, seems reasonable to admit the patient for admission given that he  has a higher risk given his history of previous MI with new T wave changes in addition to very elevated glucose and new unexplained drop in hemoglobin.  Consulted the hospitalist team and spoke with Dr. Antionette Char who will accept the patient for admission. The patient appears reasonably stabilized for admission considering the current resources, flow, and capabilities available in the ED at this time, and I doubt any other [facility] requiring further screening and/or treatment in the ED prior to admission.  Final Clinical Impressions(s) / ED Diagnoses   Final diagnoses:  Atypical chest pain  Hyperglycemia  Decreased hemoglobin    ED Discharge Orders    None       Barkley Boards, PA-C 04/21/18 1610    Dione Booze, MD 04/21/18 2236

## 2018-04-22 DIAGNOSIS — I2510 Atherosclerotic heart disease of native coronary artery without angina pectoris: Secondary | ICD-10-CM | POA: Diagnosis present

## 2018-04-22 DIAGNOSIS — N183 Chronic kidney disease, stage 3 (moderate): Secondary | ICD-10-CM | POA: Diagnosis present

## 2018-04-22 DIAGNOSIS — R7611 Nonspecific reaction to tuberculin skin test without active tuberculosis: Secondary | ICD-10-CM | POA: Diagnosis present

## 2018-04-22 DIAGNOSIS — Z794 Long term (current) use of insulin: Secondary | ICD-10-CM | POA: Diagnosis not present

## 2018-04-22 DIAGNOSIS — F17210 Nicotine dependence, cigarettes, uncomplicated: Secondary | ICD-10-CM | POA: Diagnosis present

## 2018-04-22 DIAGNOSIS — E785 Hyperlipidemia, unspecified: Secondary | ICD-10-CM | POA: Diagnosis present

## 2018-04-22 DIAGNOSIS — E876 Hypokalemia: Secondary | ICD-10-CM | POA: Diagnosis present

## 2018-04-22 DIAGNOSIS — Z833 Family history of diabetes mellitus: Secondary | ICD-10-CM | POA: Diagnosis not present

## 2018-04-22 DIAGNOSIS — E1110 Type 2 diabetes mellitus with ketoacidosis without coma: Secondary | ICD-10-CM | POA: Diagnosis present

## 2018-04-22 DIAGNOSIS — Z79899 Other long term (current) drug therapy: Secondary | ICD-10-CM | POA: Diagnosis not present

## 2018-04-22 DIAGNOSIS — R739 Hyperglycemia, unspecified: Secondary | ICD-10-CM

## 2018-04-22 DIAGNOSIS — R710 Precipitous drop in hematocrit: Secondary | ICD-10-CM | POA: Diagnosis not present

## 2018-04-22 DIAGNOSIS — E1100 Type 2 diabetes mellitus with hyperosmolarity without nonketotic hyperglycemic-hyperosmolar coma (NKHHC): Secondary | ICD-10-CM | POA: Diagnosis present

## 2018-04-22 DIAGNOSIS — Z91120 Patient's intentional underdosing of medication regimen due to financial hardship: Secondary | ICD-10-CM | POA: Diagnosis not present

## 2018-04-22 DIAGNOSIS — D509 Iron deficiency anemia, unspecified: Secondary | ICD-10-CM | POA: Diagnosis present

## 2018-04-22 DIAGNOSIS — R0789 Other chest pain: Secondary | ICD-10-CM | POA: Diagnosis present

## 2018-04-22 DIAGNOSIS — I252 Old myocardial infarction: Secondary | ICD-10-CM | POA: Diagnosis not present

## 2018-04-22 DIAGNOSIS — E1142 Type 2 diabetes mellitus with diabetic polyneuropathy: Secondary | ICD-10-CM

## 2018-04-22 DIAGNOSIS — E1140 Type 2 diabetes mellitus with diabetic neuropathy, unspecified: Secondary | ICD-10-CM | POA: Diagnosis present

## 2018-04-22 DIAGNOSIS — E782 Mixed hyperlipidemia: Secondary | ICD-10-CM

## 2018-04-22 DIAGNOSIS — T383X6A Underdosing of insulin and oral hypoglycemic [antidiabetic] drugs, initial encounter: Secondary | ICD-10-CM | POA: Diagnosis present

## 2018-04-22 DIAGNOSIS — Z8249 Family history of ischemic heart disease and other diseases of the circulatory system: Secondary | ICD-10-CM | POA: Diagnosis not present

## 2018-04-22 DIAGNOSIS — Z9114 Patient's other noncompliance with medication regimen: Secondary | ICD-10-CM | POA: Diagnosis not present

## 2018-04-22 DIAGNOSIS — I200 Unstable angina: Secondary | ICD-10-CM | POA: Diagnosis not present

## 2018-04-22 DIAGNOSIS — Y92009 Unspecified place in unspecified non-institutional (private) residence as the place of occurrence of the external cause: Secondary | ICD-10-CM | POA: Diagnosis not present

## 2018-04-22 DIAGNOSIS — Z7982 Long term (current) use of aspirin: Secondary | ICD-10-CM | POA: Diagnosis not present

## 2018-04-22 DIAGNOSIS — E1122 Type 2 diabetes mellitus with diabetic chronic kidney disease: Secondary | ICD-10-CM | POA: Diagnosis present

## 2018-04-22 DIAGNOSIS — I129 Hypertensive chronic kidney disease with stage 1 through stage 4 chronic kidney disease, or unspecified chronic kidney disease: Secondary | ICD-10-CM | POA: Diagnosis present

## 2018-04-22 DIAGNOSIS — Z9049 Acquired absence of other specified parts of digestive tract: Secondary | ICD-10-CM | POA: Diagnosis not present

## 2018-04-22 DIAGNOSIS — R079 Chest pain, unspecified: Secondary | ICD-10-CM | POA: Diagnosis present

## 2018-04-22 LAB — GLUCOSE, CAPILLARY
Glucose-Capillary: 203 mg/dL — ABNORMAL HIGH (ref 70–99)
Glucose-Capillary: 278 mg/dL — ABNORMAL HIGH (ref 70–99)
Glucose-Capillary: 295 mg/dL — ABNORMAL HIGH (ref 70–99)
Glucose-Capillary: 286 mg/dL — ABNORMAL HIGH (ref 70–99)

## 2018-04-22 LAB — CBC
HCT: 34.6 % — ABNORMAL LOW (ref 39.0–52.0)
Hemoglobin: 11.2 g/dL — ABNORMAL LOW (ref 13.0–17.0)
MCH: 26.4 pg (ref 26.0–34.0)
MCHC: 32.4 g/dL (ref 30.0–36.0)
MCV: 81.6 fL (ref 80.0–100.0)
Platelets: 147 10*3/uL — ABNORMAL LOW (ref 150–400)
RBC: 4.24 MIL/uL (ref 4.22–5.81)
RDW: 13.9 % (ref 11.5–15.5)
WBC: 5 10*3/uL (ref 4.0–10.5)
nRBC: 0 % (ref 0.0–0.2)

## 2018-04-22 LAB — BASIC METABOLIC PANEL
Anion gap: 7 (ref 5–15)
BUN: 11 mg/dL (ref 6–20)
CO2: 21 mmol/L — ABNORMAL LOW (ref 22–32)
CREATININE: 1 mg/dL (ref 0.61–1.24)
Calcium: 8.6 mg/dL — ABNORMAL LOW (ref 8.9–10.3)
Chloride: 109 mmol/L (ref 98–111)
GFR calc non Af Amer: >60 mL/min (ref >60)
Glucose, Bld: 312 mg/dL — ABNORMAL HIGH (ref 70–99)
Potassium: 3.6 mmol/L (ref 3.5–5.1)
SODIUM: 137 mmol/L (ref 135–145)

## 2018-04-22 LAB — HIV ANTIBODY (ROUTINE TESTING W REFLEX): HIV Screen 4th Generation wRfx: NONREACTIVE

## 2018-04-22 NOTE — Progress Notes (Addendum)
Progress Note  Patient Name: John Dalton Date of Encounter: 04/22/2018  Primary Cardiologist: Dr. Eden Emms  Subjective   Last episode of chest pain was 2 days ago. No SOB  Inpatient Medications    Scheduled Meds: . aspirin EC  81 mg Oral Daily  . carvedilol  12.5 mg Oral BID WC  . Chlorhexidine Gluconate Cloth  6 each Topical Daily  . enoxaparin (LOVENOX) injection  40 mg Subcutaneous Q24H  . gabapentin  600 mg Oral TID  . insulin aspart  0-20 Units Subcutaneous TID WC  . insulin aspart  0-5 Units Subcutaneous QHS  . insulin aspart  8 Units Subcutaneous TID WC  . insulin detemir  30 Units Subcutaneous BID  . lisinopril  40 mg Oral Daily  . rosuvastatin  40 mg Oral Daily  . sodium chloride flush  3 mL Intravenous Q12H   Continuous Infusions:  PRN Meds: acetaminophen **OR** acetaminophen, ondansetron **OR** ondansetron (ZOFRAN) IV   Vital Signs    Vitals:   04/22/18 0030 04/22/18 0100 04/22/18 0400 04/22/18 0800  BP: (!) 159/76  (!) 165/84 (!) 176/79  Pulse: 64  63 64  Resp: 19 (!) 31 18 16   Temp:   98.2 F (36.8 C)   TempSrc:   Oral   SpO2: 95%  94% 96%  Weight:      Height:        Intake/Output Summary (Last 24 hours) at 04/22/2018 0906 Last data filed at 04/22/2018 0569 Gross per 24 hour  Intake 938.84 ml  Output 3325 ml  Net -2386.16 ml   Last 3 Weights 04/21/2018 04/21/2018 03/26/2018  Weight (lbs) 205 lb 200 lb 213 lb  Weight (kg) 92.987 kg 90.719 kg 96.616 kg      Telemetry    NSR without significant ventricular ectopy - Personally Reviewed  ECG    NSR with TWI in inferolateral leads - Personally Reviewed  Physical Exam   GEN: No acute distress.   Neck: No JVD Cardiac: RRR, no murmurs, rubs, or gallops.  Respiratory: Clear to auscultation bilaterally. GI: Soft, nontender, non-distended  MS: No edema; No deformity. Neuro:  Nonfocal  Psych: Normal affect   Labs    Chemistry Recent Labs  Lab 04/21/18 0501 04/21/18 1004 04/21/18  1337 04/22/18 0257  NA 127* 137 141 137  K 3.4* 3.1* 3.0* 3.6  CL 96* 107 109 109  CO2 20* 22 22 21*  GLUCOSE 797* 349* 204* 312*  BUN 14 11 9 11   CREATININE 1.48* 1.10 1.06 1.00  CALCIUM 8.8* 8.7* 8.7* 8.6*  PROT 6.0*  --   --   --   ALBUMIN 3.3*  --   --   --   AST 12*  --   --   --   ALT 14  --   --   --   ALKPHOS 82  --   --   --   BILITOT 0.4  --   --   --   GFRNONAA 51* >60 >60 >60  GFRAA 60* >60 >60 >60  ANIONGAP 11 8 10 7      Hematology Recent Labs  Lab 04/21/18 0501 04/22/18 0257  WBC 5.4 5.0  RBC 4.39 4.24  HGB 11.9* 11.2*  HCT 34.6* 34.6*  MCV 78.8* 81.6  MCH 27.1 26.4  MCHC 34.4 32.4  RDW 13.6 13.9  PLT 154 147*    Cardiac Enzymes Recent Labs  Lab 04/21/18 0501 04/21/18 1004 04/21/18 1646 04/21/18 2256  TROPONINI <0.03 <0.03 <0.03 <  0.03   No results for input(s): TROPIPOC in the last 168 hours.   BNPNo results for input(s): BNP, PROBNP in the last 168 hours.   DDimer No results for input(s): DDIMER in the last 168 hours.   Radiology    Dg Chest 2 View  Result Date: 04/21/2018 CLINICAL DATA:  Sudden onset of mid chest pain EXAM: CHEST - 2 VIEW COMPARISON:  12/13/2016 FINDINGS: Normal heart size. Negative aortic and hilar contours. There is no edema, consolidation, effusion, or pneumothorax. Small nodular density at the left base most likely reflecting nipple shadow, also seen previously. IMPRESSION: No evidence of active disease. Electronically Signed   By: Marnee Spring M.D.   On: 04/21/2018 05:58    Cardiac Studies   Echo 04/21/2018 1. The left ventricle has mildly reduced systolic function, with an ejection fraction of 45-50%. The cavity size was normal. 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 Left ventricular diastolic Doppler parameters are  consistent with impaired relaxation. Indeterminate filling pressures The E/e' is 8-15. No evidence of left ventricular regional wall motion abnormalities.  2.  Moderate hypokinesis of the left ventricular, entire inferior wall.  3. The right ventricle has normal systolic function. The cavity was mildly enlarged. There is no increase in right ventricular wall thickness.  4. The aortic valve is tricuspid.  5. The interatrial septum was not well visualized.  6. The mitral valve is grossly normal.  7. The tricuspid valve is grossly normal.   Patient Profile     58 y.o. male with hx of CAD s/p MI 2006, HTN, HLD and DM 2 presented with chest pain  Assessment & Plan    1. Chest pain: with EKG changes. Trop negative.   - planning for cardiac catheterization tomorrow.   - Risk and benefit of procedure explained to the patient who display clear understanding and agree to proceed.  Discussed with patient possible procedural risk include bleeding, vascular injury, renal injury, arrythmia, MI, stroke and loss of limb or life.  - pending cath tomorrow with Dr. Tresa Endo at [facility]  2. CAD s/p MI 2006  - ASA and Crestor  3. HTN: BP continue to be elevated, on lisinopril 40mg  daily and coreg. Add amlodipine 5mg  daily  4. HLD: continue crestor  5. DM II: uncontrolled, will defer to primary team      For questions or updates, please contact [facility] Please consult [email or web address] for contact info under        Signed, Azalee Course, PA  04/22/2018, 9:06 AM    Patient examined chart reviewed Feels better no chest pain Benign exam good right radial pulse. Poorly controlled Diabetic admitted with non compliance and DKA with chest pain ? MI 2006 but no intervention For cath in am at [facility] Orders written lab called  Charlton Haws

## 2018-04-22 NOTE — Progress Notes (Signed)
PROGRESS NOTE  John Dalton MAU:[id number] DOB: [birth year] DOA: 04/21/2018 PCP: Corwin Levins, MD  Brief History   John Dalton is a 58 y.o. male with a history of IDT2DM, CAD s/p MI 2006, HTN, HLD who presented to the ED with chest pain in the setting of not taking medications for the previous 2 months. He described sharp, severe, abrupt onset chest pain last night that radiated down the left arm reminiscent of symptoms from prior ACS. He was walking to the bathroom leisurely and noted the pain resolved in about a minute without intervention. There was no associated dyspnea, palpitations, and no recent orthopnea, PND, or leg swelling. This was worse than, but similar to previous episodes over the past month or so occurring sporadically without provocative or alleviating factors, about 1-2 times per week. He took aspirin and called EMS who also administered NTG though CP had resolved. He also notes that he's had CBG readings of HIGH over the previous month with associated polyuria, polydipsia, polyphagia and unintentional weight loss. He's been sending money home to family in the [place] instead of buying and taking most of his medications.   The patient has been admitted to the ICU. He has been ruled out for MI by EKG and enzyme criteria. Cardiology has evaluated the patient. They plan for [facility] tomorrow.  Consultants  . Cardiology  Procedures  . None  Antibiotics  . None  Interval History/Subjective  The patient has been admitted to the ICU. He has been ruled out for MI by EKG and enzyme criteria. Cardiology has evaluated the patient. They plan for [facility] tomorrow.  Objective   Vitals:  Vitals:   04/22/18 0800 04/22/18 0931  BP: (!) 176/79 (!) 175/94  Pulse: 64   Resp: 16   Temp: 99.1 F (37.3 C)   SpO2: 96%     Exam:  Constitutional:  . The patient is awake, alert, and oriented x 3. No acute distress. Respiratory:  . No wheezes, rales, or rhonchi. . No tactile fremitus . No  increased work of breathing. Cardiovascular:  . Regular rate and rhythm.  . No murmur, ectopy, or gallups. . No LE extremity edema   . Normal pedal pulses Abdomen:  . Abdomen is soft, non-tender, non-distended. . No hernias, masses, or organomegaly are appreciated. . Normoactive bowel sounds.  Musculoskeletal:  . No cyanosis, clubbing or edema. Skin:  . No rashes, lesions, ulcers . palpation of skin: no induration or nodules Neurologic:  . CN 2-12 intact . Patient is moving all extremities. Psychiatric:  . Mental status o Mood, affect appropriate o Orientation to person, place, time  . judgment and insight appear intact    I have personally reviewed the following:   Today's Data  . BMP, CBC, Troponins Cardiology Data  . EKG   Scheduled Meds: . aspirin EC  81 mg Oral Daily  . carvedilol  12.5 mg Oral BID WC  . Chlorhexidine Gluconate Cloth  6 each Topical Daily  . enoxaparin (LOVENOX) injection  40 mg Subcutaneous Q24H  . gabapentin  600 mg Oral TID  . insulin aspart  0-20 Units Subcutaneous TID WC  . insulin aspart  0-5 Units Subcutaneous QHS  . insulin aspart  8 Units Subcutaneous TID WC  . insulin detemir  30 Units Subcutaneous BID  . lisinopril  40 mg Oral Daily  . rosuvastatin  40 mg Oral Daily  . sodium chloride flush  3 mL Intravenous Q12H   Continuous Infusions:  Principal Problem:  Type 2 diabetes mellitus with hyperosmolar nonketotic hyperglycemia (HCC) Active Problems:   Type 2 diabetes mellitus (HCC)   Hypertension, essential   Hyperlipidemia   DKA (diabetic ketoacidoses) (HCC)   CAD (coronary artery disease)   Intermittent left-sided chest pain  A & P     DM II with hyperosmolar non-ketotic hyperglycemia: The patient is receiving Levemir 30 units sq bid, Novolog 8 units tid, and sliding scale correction insulin qac and hs. FSBS 168 - 359 in the last 24 hours.  Hypertension: Blood pressure have continued to be high overnight on Crestor 12.5  mg bid and prinivil 40 mg daily. Will add Norvasc at night to lower blood pressures overnight.  Hyperlipidemia: Continue crestor 40mg  daily.  ACS/CAD: Intermittent left sided chest pain. MI has been ruled out. Cardiology is consulted. Plan is for [facility] tomorrow. The patient is receiving ASA, Crestor, and Coreg.  Medical non-compliance. Due to cost issues. Social services have been consulted.  DVT prophylaxis: Lovenox Code Status: Full Code Family Communication: None present Disposition Plan: Home.  Cecylia Brazill, DO [facility] Direct contact: see [email or web address]  7PM-7AM contact night coverage as above 04/22/2018, 11:07 AM  LOS: 0 days   LOS: 0 days

## 2018-04-23 ENCOUNTER — Encounter (HOSPITAL_COMMUNITY)
Admission: EM | Disposition: A | Discharge status: Home / Self Care | Payer: Self-pay | Source: Home / Self Care | Attending: Internal Medicine

## 2018-04-23 DIAGNOSIS — I2510 Atherosclerotic heart disease of native coronary artery without angina pectoris: Secondary | ICD-10-CM

## 2018-04-23 HISTORY — PX: LEFT HEART CATH AND CORONARY ANGIOGRAPHY: CATH118249

## 2018-04-23 LAB — GLUCOSE, CAPILLARY
GLUCOSE-CAPILLARY: 266 mg/dL — AB (ref 70–99)
GLUCOSE-CAPILLARY: 365 mg/dL — AB (ref 70–99)
Glucose-Capillary: 271 mg/dL — ABNORMAL HIGH (ref 70–99)
Glucose-Capillary: 408 mg/dL — ABNORMAL HIGH (ref 70–99)
Glucose-Capillary: 260 mg/dL — ABNORMAL HIGH (ref 70–99)
Glucose-Capillary: 273 mg/dL — ABNORMAL HIGH (ref 70–99)

## 2018-04-23 SURGERY — LEFT HEART CATH AND CORONARY ANGIOGRAPHY
Anesthesia: LOCAL

## 2018-04-23 MED ORDER — MIDAZOLAM HCL 2 MG/2ML IJ SOLN
INTRAMUSCULAR | Status: AC
  Filled 2018-04-23: qty 2

## 2018-04-23 MED ORDER — ONDANSETRON HCL 4 MG/2ML IJ SOLN: 4.0000 mg | Freq: Four times a day (QID) | INTRAMUSCULAR | Status: DC | PRN

## 2018-04-23 MED ORDER — LABETALOL HCL 5 MG/ML IV SOLN
INTRAVENOUS | Status: AC
  Filled 2018-04-23: qty 4

## 2018-04-23 MED ORDER — INSULIN ASPART 100 UNIT/ML ~~LOC~~ SOLN
SUBCUTANEOUS | Status: AC
  Filled 2018-04-23: qty 1

## 2018-04-23 MED ORDER — DIAZEPAM 5 MG PO TABS: 5.0000 mg | ORAL_TABLET | Freq: Four times a day (QID) | ORAL | Status: DC | PRN

## 2018-04-23 MED ORDER — HYDRALAZINE HCL 20 MG/ML IJ SOLN: 10.0000 mg | INTRAMUSCULAR | Status: AC | PRN

## 2018-04-23 MED ORDER — LIDOCAINE HCL (PF) 1 % IJ SOLN
INTRAMUSCULAR | Status: DC | PRN
  Administered 2018-04-23: 2 mL

## 2018-04-23 MED ORDER — VERAPAMIL HCL 2.5 MG/ML IV SOLN
INTRAVENOUS | Status: AC
  Filled 2018-04-23: qty 2

## 2018-04-23 MED ORDER — HEPARIN SODIUM (PORCINE) 1000 UNIT/ML IJ SOLN
INTRAMUSCULAR | Status: AC
  Filled 2018-04-23: qty 1

## 2018-04-23 MED ORDER — HEPARIN SODIUM (PORCINE) 1000 UNIT/ML IJ SOLN
INTRAMUSCULAR | Status: DC | PRN
  Administered 2018-04-23: 4500 [IU] via INTRAVENOUS

## 2018-04-23 MED ORDER — INSULIN DETEMIR 100 UNIT/ML ~~LOC~~ SOLN
40.0000 [IU] | Freq: Two times a day (BID) | SUBCUTANEOUS | Status: DC
  Administered 2018-04-23 – 2018-04-24 (×2): 40 [IU] via SUBCUTANEOUS
  Filled 2018-04-23 (×2): qty 0.4

## 2018-04-23 MED ORDER — MIDAZOLAM HCL 2 MG/2ML IJ SOLN
INTRAMUSCULAR | Status: DC | PRN
  Administered 2018-04-23: 2 mg via INTRAVENOUS

## 2018-04-23 MED ORDER — ASPIRIN 81 MG PO CHEW: 81.0000 mg | CHEWABLE_TABLET | ORAL | Status: DC

## 2018-04-23 MED ORDER — LIDOCAINE HCL (PF) 1 % IJ SOLN
INTRAMUSCULAR | Status: AC
  Filled 2018-04-23: qty 30

## 2018-04-23 MED ORDER — LABETALOL HCL 5 MG/ML IV SOLN
10.0000 mg | INTRAVENOUS | Status: AC | PRN
  Administered 2018-04-23: 10 mg via INTRAVENOUS
  Filled 2018-04-23: qty 4

## 2018-04-23 MED ORDER — HEPARIN (PORCINE) IN NACL 1000-0.9 UT/500ML-% IV SOLN
INTRAVENOUS | Status: AC
  Filled 2018-04-23: qty 1000

## 2018-04-23 MED ORDER — HYDRALAZINE HCL 20 MG/ML IJ SOLN
10.0000 mg | INTRAMUSCULAR | Status: DC | PRN
  Administered 2018-04-23: 10 mg via INTRAVENOUS
  Filled 2018-04-23: qty 1

## 2018-04-23 MED ORDER — IOHEXOL 350 MG/ML SOLN
INTRAVENOUS | Status: DC | PRN
  Administered 2018-04-23: 75 mL via INTRACARDIAC

## 2018-04-23 MED ORDER — SODIUM CHLORIDE 0.9 % WEIGHT BASED INFUSION: 3.0000 mL/kg/h | INTRAVENOUS | Status: DC

## 2018-04-23 MED ORDER — ACETAMINOPHEN 325 MG PO TABS: 650.0000 mg | ORAL_TABLET | ORAL | Status: DC | PRN

## 2018-04-23 MED ORDER — HEPARIN (PORCINE) IN NACL 1000-0.9 UT/500ML-% IV SOLN
INTRAVENOUS | Status: DC | PRN
  Administered 2018-04-23 (×2): 500 mL

## 2018-04-23 MED ORDER — SODIUM CHLORIDE 0.9 % IV SOLN: 250.0000 mL | INTRAVENOUS | Status: DC | PRN

## 2018-04-23 MED ORDER — AMLODIPINE BESYLATE 10 MG PO TABS
10.0000 mg | ORAL_TABLET | Freq: Every day | ORAL | Status: DC
  Administered 2018-04-23 (×2): 10 mg via ORAL
  Filled 2018-04-23 (×3): qty 1

## 2018-04-23 MED ORDER — SODIUM CHLORIDE 0.9% FLUSH: 3.0000 mL | INTRAVENOUS | Status: DC | PRN

## 2018-04-23 MED ORDER — SODIUM CHLORIDE 0.9 % WEIGHT BASED INFUSION
1.0000 mL/kg/h | INTRAVENOUS | Status: DC
  Administered 2018-04-23: 1 mL/kg/h via INTRAVENOUS

## 2018-04-23 MED ORDER — VERAPAMIL HCL 2.5 MG/ML IV SOLN
INTRAVENOUS | Status: DC | PRN
  Administered 2018-04-23: 10 mL via INTRA_ARTERIAL

## 2018-04-23 MED ORDER — FENTANYL CITRATE (PF) 100 MCG/2ML IJ SOLN
INTRAMUSCULAR | Status: AC
  Filled 2018-04-23: qty 2

## 2018-04-23 MED ORDER — ASPIRIN 81 MG PO CHEW
81.0000 mg | CHEWABLE_TABLET | Freq: Every day | ORAL | Status: DC
  Administered 2018-04-24: 81 mg via ORAL
  Filled 2018-04-23: qty 1

## 2018-04-23 MED ORDER — SODIUM CHLORIDE 0.9% FLUSH
3.0000 mL | Freq: Two times a day (BID) | INTRAVENOUS | Status: DC
  Administered 2018-04-24: 3 mL via INTRAVENOUS

## 2018-04-23 MED ORDER — SODIUM CHLORIDE 0.9 % IV SOLN: INTRAVENOUS | Status: AC

## 2018-04-23 MED ORDER — CARVEDILOL 25 MG PO TABS
25.0000 mg | ORAL_TABLET | Freq: Two times a day (BID) | ORAL | Status: DC
  Administered 2018-04-23 – 2018-04-24 (×2): 25 mg via ORAL
  Filled 2018-04-23 (×2): qty 1

## 2018-04-23 MED ORDER — FENTANYL CITRATE (PF) 100 MCG/2ML IJ SOLN
INTRAMUSCULAR | Status: DC | PRN
  Administered 2018-04-23: 25 ug via INTRAVENOUS

## 2018-04-23 SURGICAL SUPPLY — 11 items
CATH INFINITI 5FR ANG PIGTAIL (CATHETERS) ×2 IMPLANT
CATH OPTITORQUE TIG 4.0 5F (CATHETERS) ×2 IMPLANT
DEVICE RAD COMP TR BAND LRG (VASCULAR PRODUCTS) ×2 IMPLANT
GLIDESHEATH SLEND SS 6F .021 (SHEATH) ×2 IMPLANT
GUIDEWIRE INQWIRE 1.5J.035X260 (WIRE) ×1 IMPLANT
INQWIRE 1.5J .035X260CM (WIRE) ×2
KIT HEART LEFT (KITS) ×2 IMPLANT
PACK CARDIAC CATHETERIZATION (CUSTOM PROCEDURE TRAY) ×2 IMPLANT
SYR MEDRAD MARK 7 150ML (SYRINGE) ×2 IMPLANT
TRANSDUCER W/STOPCOCK (MISCELLANEOUS) ×2 IMPLANT
TUBING CIL FLEX 10 FLL-RA (TUBING) ×2 IMPLANT

## 2018-04-23 NOTE — H&P (View-Only) (Signed)
Progress Note  Patient Name: John Dalton Date of Encounter: 04/23/2018  Primary Cardiologist: Dr. Eden Emms  Subjective   For cath this am no pain   Inpatient Medications    Scheduled Meds: . [MAR Hold] amLODipine  10 mg Oral QHS  . aspirin  81 mg Oral Pre-Cath  . [MAR Hold] aspirin EC  81 mg Oral Daily  . [MAR Hold] carvedilol  12.5 mg Oral BID WC  . [MAR Hold] Chlorhexidine Gluconate Cloth  6 each Topical Daily  . [MAR Hold] enoxaparin (LOVENOX) injection  40 mg Subcutaneous Q24H  . [MAR Hold] gabapentin  600 mg Oral TID  . [MAR Hold] insulin aspart  0-20 Units Subcutaneous TID WC  . [MAR Hold] insulin aspart  0-5 Units Subcutaneous QHS  . [MAR Hold] insulin aspart  8 Units Subcutaneous TID WC  . [MAR Hold] insulin detemir  30 Units Subcutaneous BID  . [MAR Hold] lisinopril  40 mg Oral Daily  . [MAR Hold] rosuvastatin  40 mg Oral Daily  . [MAR Hold] sodium chloride flush  3 mL Intravenous Q12H   Continuous Infusions: . sodium chloride     PRN Meds: [MAR Hold] acetaminophen **OR** [MAR Hold] acetaminophen, [MAR Hold] hydrALAZINE, [MAR Hold] ondansetron **OR** [MAR Hold] ondansetron (ZOFRAN) IV   Vital Signs    Vitals:   04/23/18 0327 04/23/18 0700 04/23/18 0757 04/23/18 0847  BP:  (!) 153/78    Pulse:  88    Resp:  16    Temp: 98.5 F (36.9 C)  98.5 F (36.9 C) 98.7 F (37.1 C)  TempSrc: Oral  Oral Temporal  SpO2:  90%    Weight:      Height:        Intake/Output Summary (Last 24 hours) at 04/23/2018 0854 Last data filed at 04/23/2018 0730 Gross per 24 hour  Intake -  Output 2950 ml  Net -2950 ml   Last 3 Weights 04/21/2018 04/21/2018 03/26/2018  Weight (lbs) 205 lb 200 lb 213 lb  Weight (kg) 92.987 kg 90.719 kg 96.616 kg      Telemetry    NSR without significant ventricular ectopy - Personally Reviewed  ECG    NSR with TWI in inferolateral leads - Personally Reviewed  Physical Exam   GEN: No acute distress.   Neck: No JVD Cardiac: RRR, no  murmurs, rubs, or gallops.  Respiratory: Clear to auscultation bilaterally. GI: Soft, nontender, non-distended  MS: No edema; No deformity. Neuro:  Nonfocal  Psych: Normal affect   Labs    Chemistry Recent Labs  Lab 04/21/18 0501 04/21/18 1004 04/21/18 1337 04/22/18 0257  NA 127* 137 141 137  K 3.4* 3.1* 3.0* 3.6  CL 96* 107 109 109  CO2 20* 22 22 21*  GLUCOSE 797* 349* 204* 312*  BUN 14 11 9 11   CREATININE 1.48* 1.10 1.06 1.00  CALCIUM 8.8* 8.7* 8.7* 8.6*  PROT 6.0*  --   --   --   ALBUMIN 3.3*  --   --   --   AST 12*  --   --   --   ALT 14  --   --   --   ALKPHOS 82  --   --   --   BILITOT 0.4  --   --   --   GFRNONAA 51* >60 >60 >60  GFRAA 60* >60 >60 >60  ANIONGAP 11 8 10 7      Hematology Recent Labs  Lab 04/21/18 0501 04/22/18 0257  WBC 5.4 5.0  RBC 4.39 4.24  HGB 11.9* 11.2*  HCT 34.6* 34.6*  MCV 78.8* 81.6  MCH 27.1 26.4  MCHC 34.4 32.4  RDW 13.6 13.9  PLT 154 147*    Cardiac Enzymes Recent Labs  Lab 04/21/18 0501 04/21/18 1004 04/21/18 1646 04/21/18 2256  TROPONINI <0.03 <0.03 <0.03 <0.03   No results for input(s): TROPIPOC in the last 168 hours.   BNPNo results for input(s): BNP, PROBNP in the last 168 hours.   DDimer No results for input(s): DDIMER in the last 168 hours.   Radiology    No results found.  Cardiac Studies   Echo 04/21/2018 1. The left ventricle has mildly reduced systolic function, with an ejection fraction of 45-50%. The cavity size was normal. 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 Left ventricular diastolic Doppler parameters are  consistent with impaired relaxation. Indeterminate filling pressures The E/e' is 8-15. No evidence of left ventricular regional wall motion abnormalities.  2. Moderate hypokinesis of the left ventricular, entire inferior wall.  3. The right ventricle has normal systolic function. The cavity was mildly enlarged. There is no increase in right ventricular  wall thickness.  4. The aortic valve is tricuspid.  5. The interatrial septum was not well visualized.  6. The mitral valve is grossly normal.  7. The tricuspid valve is grossly normal.   Patient Profile     58 y.o. male with hx of CAD s/p MI 2006, HTN, HLD and DM 2 presented with chest pain  Assessment & Plan    1. Chest pain: with EKG changes. Trop negative.   - cath this am  Given history of MI and poorly controlled DM   - Risk and benefit of procedure explained to the patient who display clear understanding and agree to proceed.  2. CAD s/p MI 2006  - ASA and Crestor  3. HTN: BP continue to be elevated, on lisinopril 40mg  daily and coreg. Add amlodipine 5mg  daily  4. HLD: continue crestor  5. DM II: uncontrolled, will defer to primary team      For questions or updates, please contact [facility] Please consult [email or web address] for contact info under        Signed, Charlton Haws, MD  04/23/2018, 8:54 AM

## 2018-04-23 NOTE — Progress Notes (Signed)
PROGRESS NOTE  Delbert Testani YQI:[id number] DOB: [birth year] DOA: 04/21/2018 PCP: Corwin Levins, MD  Brief History   John Dalton is a 58 y.o. male with a history of IDT2DM, CAD s/p MI 2006, HTN, HLD who presented to the ED with chest pain in the setting of not taking medications for the previous 2 months. He described sharp, severe, abrupt onset chest pain last night that radiated down the left arm reminiscent of symptoms from prior ACS. He was walking to the bathroom leisurely and noted the pain resolved in about a minute without intervention. There was no associated dyspnea, palpitations, and no recent orthopnea, PND, or leg swelling. This was worse than, but similar to previous episodes over the past month or so occurring sporadically without provocative or alleviating factors, about 1-2 times per week. He took aspirin and called EMS who also administered NTG though CP had resolved. He also notes that he's had CBG readings of HIGH over the previous month with associated polyuria, polydipsia, polyphagia and unintentional weight loss. He's been sending money home to family in the [place] instead of buying and taking most of his medications.   The patient has been admitted to the ICU. He has been ruled out for MI by EKG and enzyme criteria. Cardiology has evaluated the patient.   The patient was transferred to [facility] on 04/23/2018 for LHC. It was performed by Dr. Tresa Endo. It revealed: Hyperdynamic LV function with an EF of greater than 65% without focal segmental wall motion abnormalities.  LVEDP is 5 mmHg.  No significant cardiobstructive disease with very tortuous vessels with a normal LAD, bifurcating ramus intermediate vessel with mild 10% smooth narrowing in a superior mid branch, normal very tortuous circumflex vessel, and dominant RCA with smooth 15 to 20% mid stenoses.  RECOMMENDATION: Medical therapy.  Optimal treatment of the patient's diabetes mellitus.  Optimal blood pressure control  with target blood pressure less than 120/80.  Optimal lipid management with target LDL less than 70.  The patient has returned to [facility]. He has tolerated the procedure well.  Consultants  . Cardiology  Procedures  . None  Antibiotics  . None  Interval History/Subjective  The patient has been admitted to the ICU. He has been ruled out for MI by EKG and enzyme criteria. Cardiology has evaluated the patient.   The patient was transferred to [facility] on 04/23/2018 for LHC. It was performed by Dr. Tresa Endo. It revealed: Hyperdynamic LV function with an EF of greater than 65% without focal segmental wall motion abnormalities.  LVEDP is 5 mmHg.  No significant cardiobstructive disease with very tortuous vessels with a normal LAD, bifurcating ramus intermediate vessel with mild 10% smooth narrowing in a superior mid branch, normal very tortuous circumflex vessel, and dominant RCA with smooth 15 to 20% mid stenoses.  RECOMMENDATION: Medical therapy.  Optimal treatment of the patient's diabetes mellitus.  Optimal blood pressure control with target blood pressure less than 120/80.  Optimal lipid management with target LDL less than 70.  The patient has returned to [facility]. He has tolerated the procedure well.  Objective   Vitals:  Vitals:   04/23/18 1520 04/23/18 1614  BP: (!) 161/87 (!) 150/88  Pulse: 79 84  Resp: 19 16  Temp:  97.8 F (36.6 C)  SpO2: 100% 97%    Exam:  Constitutional:  . The patient is awake, alert, and oriented x 3. No acute distress. Respiratory:  . No wheezes, rales, or rhonchi. . No tactile  fremitus . No increased work of breathing. Cardiovascular:  . Regular rate and rhythm.  . No murmur, ectopy, or gallups. . No LE extremity edema   . Normal pedal pulses Abdomen:  . Abdomen is soft, non-tender, non-distended. . No hernias, masses, or organomegaly are appreciated. . Normoactive bowel sounds.  Musculoskeletal:  . No cyanosis, clubbing or  edema. Skin:  . No rashes, lesions, ulcers . palpation of skin: no induration or nodules Neurologic:  . CN 2-12 intact . Patient is moving all extremities. Psychiatric:  . Mental status o Mood, affect appropriate o Orientation to person, place, time  . judgment and insight appear intact    I have personally reviewed the following:   Today's Data  . BMP, CBC, Troponins Cardiology Data  . EKG   Scheduled Meds: . amLODipine  10 mg Oral QHS  . [START ON 04/24/2018] aspirin  81 mg Oral Daily  . carvedilol  25 mg Oral BID WC  . enoxaparin (LOVENOX) injection  40 mg Subcutaneous Q24H  . gabapentin  600 mg Oral TID  . insulin aspart  0-20 Units Subcutaneous TID WC  . insulin aspart  0-5 Units Subcutaneous QHS  . insulin aspart  8 Units Subcutaneous TID WC  . insulin detemir  40 Units Subcutaneous BID  . lisinopril  40 mg Oral Daily  . rosuvastatin  40 mg Oral Daily  . sodium chloride flush  3 mL Intravenous Q12H  . sodium chloride flush  3 mL Intravenous Q12H   Continuous Infusions: . sodium chloride Stopped (04/23/18 1707)  . sodium chloride      Principal Problem:   Type 2 diabetes mellitus with hyperosmolar nonketotic hyperglycemia (HCC) Active Problems:   Type 2 diabetes mellitus (HCC)   Hypertension, essential   Hyperlipidemia   DKA (diabetic ketoacidoses) (HCC)   CAD (coronary artery disease)   Intermittent left-sided chest pain   Chest pain  A & P     DM II with hyperosmolar non-ketotic hyperglycemia: The patient is receiving Levemir 30 units sq bid, Novolog 8 units tid, and sliding scale correction insulin qac and hs. FSBS 266 - 408 in the last 24 hours. I have increased his levemir to 40 units sq bid.  Hypertension: Blood pressure have continued to be high overnight on Crestor 12.5 mg bid and prinivil 40 mg daily. Will add Norvasc at night to lower blood pressures overnight. I have increased his Crestory to 25 mg bid due to continued hypertension.   Hyperlipidemia: Continue crestor 40mg  daily.  ACS/CAD: Intermittent left sided chest pain. MI has been ruled out. LHC was performed today and showed non-obstructive disease. The patient is receiving ASA, Crestor, Lisinopril, and Coreg.  Medical non-compliance. Due to cost issues. Social services have been consulted.  DVT prophylaxis: Lovenox Code Status: Full Code Family Communication: None present Disposition Plan: Home. Patient will need help with his medications and a ride home tomorrow.  Jauna Raczynski, DO [facility] Direct contact: see [email or web address]  7PM-7AM contact night coverage as above 04/23/2018, 11:07 AM  LOS: 0 days   LOS: 1 day

## 2018-04-23 NOTE — Progress Notes (Signed)
Patient taken by [facility] to [facility] for cardiac cath during shift report at 0730.

## 2018-04-23 NOTE — Progress Notes (Signed)
Progress Note  Patient Name: John Dalton Date of Encounter: 04/23/2018  Primary Cardiologist: Dr. Eden Emms  Subjective   For cath this am no pain   Inpatient Medications    Scheduled Meds: . [MAR Hold] amLODipine  10 mg Oral QHS  . aspirin  81 mg Oral Pre-Cath  . [MAR Hold] aspirin EC  81 mg Oral Daily  . [MAR Hold] carvedilol  12.5 mg Oral BID WC  . [MAR Hold] Chlorhexidine Gluconate Cloth  6 each Topical Daily  . [MAR Hold] enoxaparin (LOVENOX) injection  40 mg Subcutaneous Q24H  . [MAR Hold] gabapentin  600 mg Oral TID  . [MAR Hold] insulin aspart  0-20 Units Subcutaneous TID WC  . [MAR Hold] insulin aspart  0-5 Units Subcutaneous QHS  . [MAR Hold] insulin aspart  8 Units Subcutaneous TID WC  . [MAR Hold] insulin detemir  30 Units Subcutaneous BID  . [MAR Hold] lisinopril  40 mg Oral Daily  . [MAR Hold] rosuvastatin  40 mg Oral Daily  . [MAR Hold] sodium chloride flush  3 mL Intravenous Q12H   Continuous Infusions: . sodium chloride     PRN Meds: [MAR Hold] acetaminophen **OR** [MAR Hold] acetaminophen, [MAR Hold] hydrALAZINE, [MAR Hold] ondansetron **OR** [MAR Hold] ondansetron (ZOFRAN) IV   Vital Signs    Vitals:   04/23/18 0327 04/23/18 0700 04/23/18 0757 04/23/18 0847  BP:  (!) 153/78    Pulse:  88    Resp:  16    Temp: 98.5 F (36.9 C)  98.5 F (36.9 C) 98.7 F (37.1 C)  TempSrc: Oral  Oral Temporal  SpO2:  90%    Weight:      Height:        Intake/Output Summary (Last 24 hours) at 04/23/2018 0854 Last data filed at 04/23/2018 0730 Gross per 24 hour  Intake -  Output 2950 ml  Net -2950 ml   Last 3 Weights 04/21/2018 04/21/2018 03/26/2018  Weight (lbs) 205 lb 200 lb 213 lb  Weight (kg) 92.987 kg 90.719 kg 96.616 kg      Telemetry    NSR without significant ventricular ectopy - Personally Reviewed  ECG    NSR with TWI in inferolateral leads - Personally Reviewed  Physical Exam   GEN: No acute distress.   Neck: No JVD Cardiac: RRR, no  murmurs, rubs, or gallops.  Respiratory: Clear to auscultation bilaterally. GI: Soft, nontender, non-distended  MS: No edema; No deformity. Neuro:  Nonfocal  Psych: Normal affect   Labs    Chemistry Recent Labs  Lab 04/21/18 0501 04/21/18 1004 04/21/18 1337 04/22/18 0257  NA 127* 137 141 137  K 3.4* 3.1* 3.0* 3.6  CL 96* 107 109 109  CO2 20* 22 22 21*  GLUCOSE 797* 349* 204* 312*  BUN 14 11 9 11   CREATININE 1.48* 1.10 1.06 1.00  CALCIUM 8.8* 8.7* 8.7* 8.6*  PROT 6.0*  --   --   --   ALBUMIN 3.3*  --   --   --   AST 12*  --   --   --   ALT 14  --   --   --   ALKPHOS 82  --   --   --   BILITOT 0.4  --   --   --   GFRNONAA 51* >60 >60 >60  GFRAA 60* >60 >60 >60  ANIONGAP 11 8 10 7      Hematology Recent Labs  Lab 04/21/18 0501 04/22/18 0257  WBC 5.4 5.0  RBC 4.39 4.24  HGB 11.9* 11.2*  HCT 34.6* 34.6*  MCV 78.8* 81.6  MCH 27.1 26.4  MCHC 34.4 32.4  RDW 13.6 13.9  PLT 154 147*    Cardiac Enzymes Recent Labs  Lab 04/21/18 0501 04/21/18 1004 04/21/18 1646 04/21/18 2256  TROPONINI <0.03 <0.03 <0.03 <0.03   No results for input(s): TROPIPOC in the last 168 hours.   BNPNo results for input(s): BNP, PROBNP in the last 168 hours.   DDimer No results for input(s): DDIMER in the last 168 hours.   Radiology    No results found.  Cardiac Studies   Echo 04/21/2018 1. The left ventricle has mildly reduced systolic function, with an ejection fraction of 45-50%. The cavity size was normal. 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 Left ventricular diastolic Doppler parameters are  consistent with impaired relaxation. Indeterminate filling pressures The E/e' is 8-15. No evidence of left ventricular regional wall motion abnormalities.  2. Moderate hypokinesis of the left ventricular, entire inferior wall.  3. The right ventricle has normal systolic function. The cavity was mildly enlarged. There is no increase in right ventricular  wall thickness.  4. The aortic valve is tricuspid.  5. The interatrial septum was not well visualized.  6. The mitral valve is grossly normal.  7. The tricuspid valve is grossly normal.   Patient Profile     57 y.o. male with hx of CAD s/p MI 2006, HTN, HLD and DM 2 presented with chest pain  Assessment & Plan    1. Chest pain: with EKG changes. Trop negative.   - cath this am  Given history of MI and poorly controlled DM   - Risk and benefit of procedure explained to the patient who display clear understanding and agree to proceed.  2. CAD s/p MI 2006  - ASA and Crestor  3. HTN: BP continue to be elevated, on lisinopril 40mg daily and coreg. Add amlodipine 5mg daily  4. HLD: continue crestor  5. DM II: uncontrolled, will defer to primary team      For questions or updates, please contact [facility] Please consult [email or web address] for contact info under        Signed, Williette Loewe, MD  04/23/2018, 8:54 AM      

## 2018-04-23 NOTE — Interval H&P Note (Signed)
Cath Lab Visit (complete for each Cath Lab visit)  Clinical Evaluation Leading to the Procedure:   ACS: No.  Non-ACS:    Anginal Classification: CCS III  Anti-ischemic medical therapy: Minimal Therapy (1 class of medications)  Non-Invasive Test Results: No non-invasive testing performed  Prior CABG: No previous CABG      History and Physical Interval Note:  04/23/2018 10:41 AM  John Dalton  has presented today for surgery, with the diagnosis of chest pain.  The various methods of treatment have been discussed with the patient and family. After consideration of risks, benefits and other options for treatment, the patient has consented to  Procedure(s): LEFT HEART CATH AND CORONARY ANGIOGRAPHY (N/A) as a surgical intervention.  The patient's history has been reviewed, patient examined, no change in status, stable for surgery.  I have reviewed the patient's chart and labs.  Questions were answered to the patient's satisfaction.     Nicki Guadalajara

## 2018-04-23 NOTE — Progress Notes (Signed)
TR BAND REMOVAL  LOCATION:    Radial rt radial   DEFLATED PER PROTOCOL:   yes  TIME BAND OFF / DRESSING APPLIED:    1520/gauze and tegaderm  SITE UPON ARRIVAL:    Level  0   SITE AFTER BAND REMOVAL:    Level 0  CIRCULATION SENSATION AND MOVEMENT:    Within Normal Limits : yes, rt hand and fingers warm and pink, sensation present, rt arm elevated on pillow  COMMENTS:   Instructions reviewed w/patient

## 2018-04-24 ENCOUNTER — Telehealth: Payer: Self-pay | Admitting: *Deleted

## 2018-04-24 ENCOUNTER — Encounter (HOSPITAL_COMMUNITY): Payer: Self-pay | Admitting: Cardiovascular Disease

## 2018-04-24 LAB — BASIC METABOLIC PANEL
Anion gap: 10 (ref 5–15)
BUN: 12 mg/dL (ref 6–20)
CALCIUM: 9 mg/dL (ref 8.9–10.3)
CO2: 24 mmol/L (ref 22–32)
Chloride: 103 mmol/L (ref 98–111)
Creatinine, Ser: 0.92 mg/dL (ref 0.61–1.24)
GFR calc Af Amer: >60 mL/min (ref >60)
GFR calc non Af Amer: >60 mL/min (ref >60)
Glucose, Bld: 277 mg/dL — ABNORMAL HIGH (ref 70–99)
Potassium: 3.4 mmol/L — ABNORMAL LOW (ref 3.5–5.1)
Sodium: 137 mmol/L (ref 135–145)

## 2018-04-24 LAB — CBC
HCT: 36.3 % — ABNORMAL LOW (ref 39.0–52.0)
Hemoglobin: 12.3 g/dL — ABNORMAL LOW (ref 13.0–17.0)
MCH: 27.3 pg (ref 26.0–34.0)
MCHC: 33.9 g/dL (ref 30.0–36.0)
MCV: 80.7 fL (ref 80.0–100.0)
Platelets: 147 10*3/uL — ABNORMAL LOW (ref 150–400)
RBC: 4.5 MIL/uL (ref 4.22–5.81)
RDW: 14.3 % (ref 11.5–15.5)
WBC: 5.3 10*3/uL (ref 4.0–10.5)
nRBC: 0 % (ref 0.0–0.2)

## 2018-04-24 LAB — GLUCOSE, CAPILLARY: Glucose-Capillary: 261 mg/dL — ABNORMAL HIGH (ref 70–99)

## 2018-04-24 MED ORDER — POTASSIUM CHLORIDE CRYS ER 20 MEQ PO TBCR
40.0000 meq | EXTENDED_RELEASE_TABLET | Freq: Once | ORAL | Status: AC
  Administered 2018-04-24: 40 meq via ORAL
  Filled 2018-04-24: qty 2

## 2018-04-24 MED ORDER — ACETAMINOPHEN 325 MG PO TABS: 650.0000 mg | ORAL_TABLET | ORAL | 0 refills | Status: AC | PRN

## 2018-04-24 MED ORDER — AMLODIPINE BESYLATE 10 MG PO TABS: 10.0000 mg | ORAL_TABLET | Freq: Every day | ORAL | 0 refills | Status: DC

## 2018-04-24 MED ORDER — HYDROCHLOROTHIAZIDE 25 MG PO TABS: 25.0000 mg | ORAL_TABLET | Freq: Every day | ORAL | 0 refills | Status: DC

## 2018-04-24 MED ORDER — CARVEDILOL 25 MG PO TABS: 37.5000 mg | ORAL_TABLET | Freq: Two times a day (BID) | ORAL | Status: DC

## 2018-04-24 MED ORDER — INSULIN ASPART 100 UNIT/ML ~~LOC~~ SOLN: 8.0000 [IU] | Freq: Three times a day (TID) | SUBCUTANEOUS | 11 refills | Status: DC

## 2018-04-24 MED ORDER — HYDROCHLOROTHIAZIDE 25 MG PO TABS: 25.0000 mg | ORAL_TABLET | Freq: Every day | ORAL | Status: DC

## 2018-04-24 MED ORDER — CARVEDILOL 25 MG PO TABS: 37.5000 mg | ORAL_TABLET | Freq: Two times a day (BID) | ORAL | 0 refills | Status: DC

## 2018-04-24 NOTE — Telephone Encounter (Signed)
Patient was on TCM list. Admit date: 04/21/2018 Discharge date: 04/24/2018 I called to check on patient. I spoke to patient's spouse. She stated patient is asleep and doing well. I offered to schedule a virtual visit with PCP if needed, but she had to end the call at the time.

## 2018-04-24 NOTE — Discharge Instructions (Signed)
Diabetes Basics    Diabetes (diabetes mellitus) is a long-term (chronic) disease. It occurs when the body does not properly use sugar (glucose) that is released from food after you eat.  Diabetes may be caused by one or both of these problems:  · Your pancreas does not make enough of a hormone called insulin.  · Your body does not react in a normal way to insulin that it makes.  Insulin lets sugars (glucose) go into cells in your body. This gives you energy. If you have diabetes, sugars cannot get into cells. This causes high blood sugar (hyperglycemia).  Follow these instructions at home:  How is diabetes treated?  You may need to take insulin or other diabetes medicines daily to keep your blood sugar in balance. Take your diabetes medicines every day as told by your doctor. List your diabetes medicines here:  Diabetes medicines  · Name of medicine: ______________________________  ? Amount (dose): _______________ Time (a.m./p.m.): _______________ Notes: ___________________________________  · Name of medicine: ______________________________  ? Amount (dose): _______________ Time (a.m./p.m.): _______________ Notes: ___________________________________  · Name of medicine: ______________________________  ? Amount (dose): _______________ Time (a.m./p.m.): _______________ Notes: ___________________________________  If you use insulin, you will learn how to give yourself insulin by injection. You may need to adjust the amount based on the food that you eat. List the types of insulin you use here:  Insulin  · Insulin type: ______________________________  ? Amount (dose): _______________ Time (a.m./p.m.): _______________ Notes: ___________________________________  · Insulin type: ______________________________  ? Amount (dose): _______________ Time (a.m./p.m.): _______________ Notes: ___________________________________  · Insulin type: ______________________________  ? Amount (dose): _______________ Time (a.m./p.m.):  _______________ Notes: ___________________________________  · Insulin type: ______________________________  ? Amount (dose): _______________ Time (a.m./p.m.): _______________ Notes: ___________________________________  · Insulin type: ______________________________  ? Amount (dose): _______________ Time (a.m./p.m.): _______________ Notes: ___________________________________  How do I manage my blood sugar?    Check your blood sugar levels using a blood glucose monitor as directed by your doctor.  Your doctor will set treatment goals for you. Generally, you should have these blood sugar levels:  · Before meals (preprandial): 80-130 mg/dL (4.4-7.2 mmol/L).  · After meals (postprandial): below 180 mg/dL (10 mmol/L).  · A1c level: less than 7%.  Write down the times that you will check your blood sugar levels:  Blood sugar checks  · Time: _______________ Notes: ___________________________________  · Time: _______________ Notes: ___________________________________  · Time: _______________ Notes: ___________________________________  · Time: _______________ Notes: ___________________________________  · Time: _______________ Notes: ___________________________________  · Time: _______________ Notes: ___________________________________    What do I need to know about low blood sugar?  Low blood sugar is called hypoglycemia. This is when blood sugar is at or below 70 mg/dL (3.9 mmol/L). Symptoms may include:  · Feeling:  ? Hungry.  ? Worried or nervous (anxious).  ? Sweaty and clammy.  ? Confused.  ? Dizzy.  ? Sleepy.  ? Sick to your stomach (nauseous).  · Having:  ? A fast heartbeat.  ? A headache.  ? A change in your vision.  ? Tingling or no feeling (numbness) around the mouth, lips, or tongue.  ? Jerky movements that you cannot control (seizure).  · Having trouble with:  ? Moving (coordination).  ? Sleeping.  ? Passing out (fainting).  ? Getting upset easily (irritability).  Treating low blood sugar  To treat low blood  sugar, eat or drink something sugary right away. If you can think clearly and swallow safely, follow the 15:15   rule:  · Take 15 grams of a fast-acting carb (carbohydrate). Talk with your doctor about how much you should take.  · Some fast-acting carbs are:  ? Sugar tablets (glucose pills). Take 3-4 glucose pills.  ? 6-8 pieces of hard candy.  ? 4-6 oz (120-150 mL) of fruit juice.  ? 4-6 oz (120-150 mL) of regular (not diet) soda.  ? 1 Tbsp (15 mL) honey or sugar.  · Check your blood sugar 15 minutes after you take the carb.  · If your blood sugar is still at or below 70 mg/dL (3.9 mmol/L), take 15 grams of a carb again.  · If your blood sugar does not go above 70 mg/dL (3.9 mmol/L) after 3 tries, get help right away.  · After your blood sugar goes back to normal, eat a meal or a snack within 1 hour.  Treating very low blood sugar  If your blood sugar is at or below 54 mg/dL (3 mmol/L), you have very low blood sugar (severe hypoglycemia). This is an emergency. Do not wait to see if the symptoms will go away. Get medical help right away. Call your local emergency services (911 in the [place] Do not drive yourself to the hospital.  Questions to ask your health care provider  · Do I need to meet with a diabetes educator?  · What equipment will I need to care for myself at home?  · What diabetes medicines do I need? When should I take them?  · How often do I need to check my blood sugar?  · What number can I call if I have questions?  · When is my next doctor's visit?  · Where can I find a support group for people with diabetes?  Where to find more information  · American Diabetes Association: www.diabetes.org  · American Association of Diabetes Educators: www.diabeteseducator.org/patient-resources  Contact a doctor if:  · Your blood sugar is at or above 240 mg/dL (13.3 mmol/L) for 2 days in a row.  · You have been sick or have had a fever for 2 days or more, and you are not getting better.  · You have any of these  problems for more than 6 hours:  ? You cannot eat or drink.  ? You feel sick to your stomach (nauseous).  ? You throw up (vomit).  ? You have watery poop (diarrhea).  Get help right away if:  · Your blood sugar is lower than 54 mg/dL (3 mmol/L).  · You get confused.  · You have trouble:  ? Thinking clearly.  ? Breathing.  Summary  · Diabetes (diabetes mellitus) is a long-term (chronic) disease. It occurs when the body does not properly use sugar (glucose) that is released from food after digestion.  · Take insulin and diabetes medicines as told.  · Check your blood sugar every day, as often as told.  · Keep all follow-up visits as told by your doctor. This is important.  This information is not intended to replace advice given to you by your health care provider. Make sure you discuss any questions you have with your health care provider.  Document Released: 04/18/2017 Document Revised: 07/07/2017 Document Reviewed: 04/18/2017  Elsevier Interactive Patient Education © 2019 [employer]

## 2018-04-24 NOTE — Discharge Summary (Signed)
Physician Discharge Summary  [place] Cuvelier BCW:[id number] DOB: 1960/09/22 DOA: 04/21/2018  PCP: Corwin Levins, MD  Admit date: 04/21/2018 Discharge date: 04/24/2018  Recommendations for Outpatient Follow-up:  1. Follow up with PCP in 7-10 days.  2. Stop smoking 3. Follow a carbohydrate controlled diet. 4. Walk a minimum of 20 minutes a day. Increase as able.  Discharge Diagnoses: Principal diagnosis is #1 1. Chest pain - non-cardiac 2. Non-obstructive CAD 3. Essential hypertension: uncontrolled 4. DM II - Uncontrolled 5. Tobacco abuse.  Discharge Condition: Fair Disposition: Home  Diet recommendation: Carbohydrate controlled with 2 gm sodium restriction.  Filed Weights   04/21/18 0448 04/21/18 0508 04/24/18 0546  Weight: 90.7 kg 93 kg 93.4 kg    History of present illness:  John Dalton a 58 y.o.malewith a history ofIDT2DM, CAD s/p MI 2006, HTN, HLD who presented to the ED with chest pain in the setting of not taking medications for the previous 2 months. He described sharp, severe, abrupt onset chest pain last night that radiated down the left arm reminiscent of symptoms from prior ACS. He was walking to the bathroom leisurely and noted the pain resolved in about a minute without intervention. There was no associated dyspnea, palpitations, and no recent orthopnea, PND, or leg swelling. This was worse than, but similar to previous episodes over the past month or so occurring sporadically without provocative or alleviating factors, about 1-2 times per week. He took aspirin and called EMS who also administered NTG though CP had resolved. He also notes that he's had CBG readings of HIGH over the previous month with associated polyuria, polydipsia, polyphagia and unintentional weight loss. He's been sending money home to family in the [place] instead of buying and taking most of his medications.  Hospital Course:  The patient has been admitted to the ICU. He has been ruled out for  MI by EKG and enzyme criteria. Cardiology has evaluated the patient.   The patient was transferred to [facility] on 04/23/2018 for LHC. It was performed by Dr. Tresa Endo. It revealed: Hyperdynamic LV function with an EF of greater than 65% without focal segmental wall motion abnormalities. LVEDP is 5 mmHg.  No significant cardiobstructive disease with very tortuous vessels with a normal LAD, bifurcating ramus intermediate vessel with mild 10% smooth narrowing in a superior mid branch, normal very tortuous circumflex vessel, and dominant RCA with smooth 15 to 20% mid stenoses.  RECOMMENDATION: Medical therapy. Optimal treatment of the patient's diabetes mellitus. Optimal blood pressure control with target blood pressure less than 120/80. Optimal lipid management with target LDL less than 70.  The patient has returned to [facility]. He has tolerated the procedure well.   Adjustments to the patient's insulin and antihypertensive regimens have been made to better control his blood glucose and blood pressure. He is recommended to stop smoking and to walk a minimum of 20 minutes a day.  He will be discharged to home in fair condition.  Today's assessment: S: The patient is awake, alert, and oriented x 3. No acute distress. O: Vitals:  Vitals:   04/24/18 0534 04/24/18 0817  BP: (!) 150/84 (!) 150/80  Pulse: 84 81  Resp: 20   Temp: (!) 97.5 F (36.4 C)   SpO2: 97%     Constitutional:  . The patient is awake, alert, and oriented x 3. No acute distress. Respiratory:  . No wheezes, rales, or rhonchi . No tactile fremitus. Marland Kitchen Respiratory effort normal. No retractions or accessory muscle use Cardiovascular:  .  Regular rate and rhythm. . No murmurs, ectopy, or gallups . No LE extremity edema   . Normal pedal pulses Abdomen:  . Abdomen is soft, non-tender, non-distended. . No hernias, masses, or organomegaly is appreciated. . Normoactive bowel sounds. Musculoskeletal:  . No cyanosis,  clubbing or edema Skin:  . No rashes, lesions, ulcers . palpation of skin: no induration or nodules Neurologic:  . CN 2-12 intact . Sensation all 4 extremities intact Psychiatric:  . judgement and insight appear normal . Mental status o Mood, affect appropriate o Orientation to person, place, time     Discharge Instructions  Discharge Instructions    Activity as tolerated - No restrictions   Complete by:  As directed    Call MD for:  persistant nausea and vomiting   Complete by:  As directed    Call MD for:  severe uncontrolled pain   Complete by:  As directed    Diet Carb Modified   Complete by:  As directed    Low sodium, heart healthy   Discharge instructions   Complete by:  As directed    No Smoking. Follow up with PCP in 7-10 days. Follow up with cardiology as directed.   Increase activity slowly   Complete by:  As directed      Allergies as of 04/24/2018      Reactions   Metformin And Related Diarrhea      Medication List    STOP taking these medications   atenolol 50 MG tablet Commonly known as:  TENORMIN   cyclobenzaprine 5 MG tablet Commonly known as:  FLEXERIL     TAKE these medications   acetaminophen 325 MG tablet Commonly known as:  TYLENOL Take 2 tablets (650 mg total) by mouth every 4 (four) hours as needed for headache or mild pain.   amLODipine 10 MG tablet Commonly known as:  NORVASC Take 1 tablet (10 mg total) by mouth at bedtime.   aspirin EC 81 MG tablet Take 81 mg by mouth daily.   carvedilol 25 MG tablet Commonly known as:  COREG Take 1.5 tablets (37.5 mg total) by mouth 2 (two) times daily with a meal.   CENTRUM ADULTS PO Take 1 tablet daily by mouth.   gabapentin 600 MG tablet Commonly known as:  NEURONTIN Take 2 tablets (1,200 mg total) by mouth 3 (three) times daily.   hydrochlorothiazide 25 MG tablet Commonly known as:  HYDRODIURIL Take 1 tablet (25 mg total) by mouth at bedtime.   insulin aspart 100 UNIT/ML  injection Commonly known as:  novoLOG Inject 8 Units into the skin 3 (three) times daily with meals.   insulin NPH-regular Human (70-30) 100 UNIT/ML injection Commonly known as:  NovoLIN 70/30 ReliOn INJECT 75 UNITS SUBCUTANEOUSLY TWICE DAILY WITH A MEAls   Insulin Syringe-Needle U-100 31G X 15/64" 0.5 ML Misc Commonly known as:  BD Insulin Syringe Ultrafine Use 1 needle per injection to inject insulin 1-4 times daily as instructed.   lisinopril 40 MG tablet Commonly known as:  PRINIVIL,ZESTRIL Take 1 tablet (40 mg total) by mouth daily.   rosuvastatin 40 MG tablet Commonly known as:  Crestor Take 1 tablet (40 mg total) by mouth daily.      Allergies  Allergen Reactions  . Metformin And Related Diarrhea    The results of significant diagnostics from this hospitalization (including imaging, microbiology, ancillary and laboratory) are listed below for reference.    Significant Diagnostic Studies: Dg Chest 2 View  Result Date:  04/21/2018 CLINICAL DATA:  Sudden onset of mid chest pain EXAM: CHEST - 2 VIEW COMPARISON:  12/13/2016 FINDINGS: Normal heart size. Negative aortic and hilar contours. There is no edema, consolidation, effusion, or pneumothorax. Small nodular density at the left base most likely reflecting nipple shadow, also seen previously. IMPRESSION: No evidence of active disease. Electronically Signed   By: Marnee Spring M.D.   On: 04/21/2018 05:58    Microbiology: Recent Results (from the past 240 hour(s))  MRSA PCR Screening     Status: None   Collection Time: 04/21/18  8:35 AM  Result Value Ref Range Status   MRSA by PCR NEGATIVE NEGATIVE Final    Comment:        The GeneXpert MRSA Assay (FDA approved for NASAL specimens only), is one component of a comprehensive MRSA colonization surveillance program. It is not intended to diagnose MRSA infection nor to guide or monitor treatment for MRSA infections. Performed at [facility], [address], [place], [place] [address]      Labs: Basic Metabolic Panel: Recent Labs  Lab 04/21/18 0501 04/21/18 1004 04/21/18 1337 04/22/18 0257 04/24/18 0316  NA 127* 137 141 137 137  K 3.4* 3.1* 3.0* 3.6 3.4*  CL 96* 107 109 109 103  CO2 20* 22 22 21* 24  GLUCOSE 797* 349* 204* 312* 277*  BUN CREATININE 1.48* 1.10 1.06 1.00 0.92  CALCIUM 8.8* 8.7* 8.7* 8.6* 9.0   Liver Function Tests: Recent Labs  Lab 04/21/18 0501  AST 12*  ALT 14  ALKPHOS 82  BILITOT 0.4  PROT 6.0*  ALBUMIN 3.3*   No results for input(s): LIPASE, AMYLASE in the last 168 hours. No results for input(s): AMMONIA in the last 168 hours. CBC: Recent Labs  Lab 04/21/18 0501 04/22/18 0257 04/24/18 0316  WBC 5.4 5.0 5.3  HGB 11.9* 11.2* 12.3*  HCT 34.6* 34.6* 36.3*  MCV 78.8* 81.6 80.7  PLT 154 147* 147*   Cardiac Enzymes: Recent Labs  Lab 04/21/18 0501 04/21/18 1004 04/21/18 1646 04/21/18 2256  TROPONINI <0.03 <0.03 <0.03 <0.03   BNP: BNP (last 3 results) No results for input(s): BNP in the last 8760 hours.  ProBNP (last 3 results) No results for input(s): PROBNP in the last 8760 hours.  CBG: Recent Labs  Lab 04/23/18 1324 04/23/18 1525 04/23/18 1617 04/23/18 2039 04/24/18 0734  GLUCAP 408* 365* 260* 273* 261*    Principal Problem:   Type 2 diabetes mellitus with hyperosmolar nonketotic hyperglycemia (HCC) Active Problems:   Type 2 diabetes mellitus (HCC)   Hypertension, essential   Hyperlipidemia   DKA (diabetic ketoacidoses) (HCC)   CAD (coronary artery disease)   Intermittent left-sided chest pain   Chest pain   Time coordinating discharge: 38 minutes.  Signed:        Magaline Steinberg, DO [facility]  04/24/2018, 10:59 AM

## 2018-04-24 NOTE — Progress Notes (Addendum)
Progress Note  Patient Name: John Dalton Date of Encounter: 04/24/2018   Primary Cardiologist: Dr. Eden Emms  Subjective   Pt feeling better this AM. No chest pain. Non-obstructive CAD per cath. Needs better BP control.   Inpatient Medications    Scheduled Meds: . amLODipine  10 mg Oral QHS  . aspirin  81 mg Oral Daily  . carvedilol  25 mg Oral BID WC  . enoxaparin (LOVENOX) injection  40 mg Subcutaneous Q24H  . gabapentin  600 mg Oral TID  . insulin aspart  0-20 Units Subcutaneous TID WC  . insulin aspart  0-5 Units Subcutaneous QHS  . insulin aspart  8 Units Subcutaneous TID WC  . insulin detemir  40 Units Subcutaneous BID  . lisinopril  40 mg Oral Daily  . rosuvastatin  40 mg Oral Daily  . sodium chloride flush  3 mL Intravenous Q12H  . sodium chloride flush  3 mL Intravenous Q12H   Continuous Infusions: . sodium chloride     PRN Meds: sodium chloride, acetaminophen, diazepam, hydrALAZINE, ondansetron (ZOFRAN) IV, ondansetron **OR** [DISCONTINUED] ondansetron (ZOFRAN) IV, sodium chloride flush   Vital Signs    Vitals:   04/23/18 1614 04/23/18 2041 04/24/18 0534 04/24/18 0546  BP: (!) 150/88 115/64 (!) 150/84   Pulse: 84 75 84   Resp: 16 18 20    Temp: 97.8 F (36.6 C) 98.5 F (36.9 C) (!) 97.5 F (36.4 C)   TempSrc: Oral Oral Oral   SpO2: 97% 97% 97%   Weight:    93.4 kg  Height:        Intake/Output Summary (Last 24 hours) at 04/24/2018 0744 Last data filed at 04/24/2018 0705 Gross per 24 hour  Intake 1534.35 ml  Output 1750 ml  Net -215.65 ml   Filed Weights   04/21/18 0448 04/21/18 0508 04/24/18 0546  Weight: 90.7 kg 93 kg 93.4 kg   Physical Exam   General: Well developed, well nourished, NAD Skin: Warm, dry, intact  Head: Normocephalic, atraumatic, sclera non-icteric, no xanthomas, clear, moist mucus membranes. Neck: Negative for carotid bruits. No JVD Lungs:Clear to ausculation bilaterally. No wheezes, rales, or rhonchi. Breathing is  unlabored. Cardiovascular: RRR with S1 S2. No murmurs, rubs, gallops, or LV heave appreciated. Abdomen: Soft, non-tender, non-distended with normoactive bowel sounds. No hepatomegaly, No rebound/guarding. No obvious abdominal masses. MSK: Strength and tone appear normal for age. 5/5 in all extremities Extremities: No edema. No clubbing or cyanosis. DP/PT pulses 2+ bilaterally Neuro: Alert and oriented. No focal deficits. No facial asymmetry. MAE spontaneously. Psych: Responds to questions appropriately with normal affect.    Labs    Chemistry Recent Labs  Lab 04/21/18 0501  04/21/18 1337 04/22/18 0257 04/24/18 0316  NA 127*   < > 141 137 137  K 3.4*   < > 3.0* 3.6 3.4*  CL 96*   < > 109 109 103  CO2 20*   < > 22 21* 24  GLUCOSE 797*   < > 204* 312* 277*  BUN 14   < > 9 11 12   CREATININE 1.48*   < > 1.06 1.00 0.92  CALCIUM 8.8*   < > 8.7* 8.6* 9.0  PROT 6.0*  --   --   --   --   ALBUMIN 3.3*  --   --   --   --   AST 12*  --   --   --   --   ALT 14  --   --   --   --  ALKPHOS 82  --   --   --   --   BILITOT 0.4  --   --   --   --   GFRNONAA 51*   < > >60 >60 >60  GFRAA 60*   < > >60 >60 >60  ANIONGAP 11   < > 10 7 10    < > = values in this interval not displayed.     Hematology Recent Labs  Lab 04/21/18 0501 04/22/18 0257 04/24/18 0316  WBC 5.4 5.0 5.3  RBC 4.39 4.24 4.50  HGB 11.9* 11.2* 12.3*  HCT 34.6* 34.6* 36.3*  MCV 78.8* 81.6 80.7  MCH 27.1 26.4 27.3  MCHC 34.4 32.4 33.9  RDW 13.6 13.9 14.3  PLT 154 147* 147*   Cardiac Enzymes Recent Labs  Lab 04/21/18 0501 04/21/18 1004 04/21/18 1646 04/21/18 2256  TROPONINI <0.03 <0.03 <0.03 <0.03   No results for input(s): TROPIPOC in the last 168 hours.   BNPNo results for input(s): BNP, PROBNP in the last 168 hours.   DDimer No results for input(s): DDIMER in the last 168 hours.   Radiology    No results found.  Telemetry    04/24/2018 NSR - Personally Reviewed  ECG    No new tracing as of  04/24/2018 - Personally Reviewed  Cardiac Studies   Echo 04/21/2018 1. The left ventricle has mildly reduced systolic function, with an ejection fraction of 45-50%. The cavity size was normal. 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 Left ventricular diastolic Doppler parameters are  consistent with impaired relaxation. Indeterminate filling pressures The E/e' is 8-15. No evidence of left ventricular regional wall motion abnormalities. 2. Moderate hypokinesis of the left ventricular, entire inferior wall. 3. The right ventricle has normal systolic function. The cavity was mildly enlarged. There is no increase in right ventricular wall thickness. 4. The aortic valve is tricuspid. 5. The interatrial septum was not well visualized. 6. The mitral valve is grossly normal. 7. The tricuspid valve is grossly normal.  Cardiac catheterization 04/23/2018:  Mid RCA-1 lesion is 15% stenosed.  Mid RCA-2 lesion is 20% stenosed.  There is hyperdynamic left ventricular systolic function.  The left ventricular ejection fraction is greater than 65% by visual estimate.   Hyperdynamic LV function with an EF of greater than 65% without focal segmental wall motion abnormalities.  LVEDP is 5 mmHg.  No significant cardiobstructive disease with very tortuous vessels with a normal LAD, bifurcating ramus intermediate vessel with mild 10% smooth narrowing in a superior mid branch, normal very tortuous circumflex vessel, and dominant RCA with smooth 15 to 20% mid stenoses.  RECOMMENDATION: Medical therapy.  Optimal treatment of the patient's diabetes mellitus.  Optimal blood pressure control with target blood pressure less than 120/80.  Optimal lipid management with target LDL less than 70.  Patient Profile     58 y.o. male  hx of CADs/pMI 2006, HTN, HLD and DM 2 presented with chest pain  Assessment & Plan    1. Chest pain with EKG changes and prior MI 2006:   -Cath completed 04/23/2018 with non-obstructive disease with recommendations for medical therapy with focus on optimal treatment of DM2, optimal blood pressure control with target blood pressure less than 120/80, and lipid management with target LDL less than 70 -Denies recurrent chest pain, cath site unremarkable  -Continue ASA, statin, carvedilol   2. HTN:  -Elevated, 150/84>115/64>150/88>161/87 -Continues to be elevated despite lisinopril 40mg  daily, carvedilol 25, amlodipine 10 -Will  add HCTZ 25 and monitor BP   4. HLD:  -Continue crestor  5. DM II:  -Uncontrolled, will defer to primary team    6. Hypokalemia: -K+,  3.4 today -Will replace with PO KDur and start PO daily    Signed, Georgie Chard NP-C [facility] Pager: [phone] 04/24/2018, 7:44 AM     For questions or updates, please contact   Please consult [email or web address] for contact info under Cardiology/STEMI.  Patient examined chart reviewed. Post cath sight right radial A. Agree with adding diuretic for BP control Needs better medical f/u for DM and HTN.  No need for cardiology f/u will sign off  Charlton Haws

## 2018-04-24 NOTE — Progress Notes (Signed)
Pt discharged home, AVS and medication reviewed with pt. No questions at this time. Instructed to follow up with PCP. Assisted off unit with wheelchair and pt not distressed.

## 2018-04-24 NOTE — Care Management Important Message (Signed)
Important Message  Patient Details  Name: Jap Haasch MRN: [id number] Date of Birth: 1960/11/05   Medicare Important Message Given:  Yes    Caren Macadam 04/24/2018, 10:49 AMImportant Message  Patient Details  Name: Roald Wollam MRN: [id number] Date of Birth: 21-Nov-1960   Medicare Important Message Given:  Yes    Caren Macadam 04/24/2018, 10:49 AM

## 2018-04-28 ENCOUNTER — Encounter: Payer: Self-pay | Admitting: Internal Medicine

## 2018-07-09 ENCOUNTER — Encounter: Payer: Self-pay | Admitting: Internal Medicine

## 2018-08-24 ENCOUNTER — Telehealth: Payer: Self-pay | Admitting: Internal Medicine

## 2018-08-24 MED ORDER — ACCU-CHEK AVIVA VI STRP: ORAL_STRIP | 12 refills | Status: AC

## 2018-08-24 NOTE — Telephone Encounter (Signed)
Medication Refill - Medication: accu-chek avia plus Test strips (Patient is completely out and needs refill before upcoming appt )  Has the patient contacted their pharmacy? Yes (Agent: If no, request that the patient contact the pharmacy for the refill.) (Agent: If yes, when and what did the pharmacy advise?)Contact Pcp  Preferred Pharmacy (with phone number or street name): [facility] [phone] - [place], [address] AT [facility] [address] [phone] (Phone) [phone] (Fax)     Agent: Please be advised that RX refills may take up to 3 business days. We ask that you follow-up with your pharmacy.

## 2018-09-01 ENCOUNTER — Other Ambulatory Visit: Payer: Self-pay | Admitting: Internal Medicine

## 2018-09-01 NOTE — Telephone Encounter (Signed)
Pt is asking for this to be refilled asap

## 2018-09-01 NOTE — Telephone Encounter (Signed)
Patient called to check on status of refill.

## 2018-09-22 ENCOUNTER — Other Ambulatory Visit (INDEPENDENT_AMBULATORY_CARE_PROVIDER_SITE_OTHER): Payer: Medicare HMO

## 2018-09-22 ENCOUNTER — Telehealth: Payer: Self-pay

## 2018-09-22 ENCOUNTER — Other Ambulatory Visit: Payer: Medicare HMO

## 2018-09-22 DIAGNOSIS — E1142 Type 2 diabetes mellitus with diabetic polyneuropathy: Secondary | ICD-10-CM

## 2018-09-22 LAB — HEPATIC FUNCTION PANEL
ALT: 15 U/L (ref 0–53)
AST: 12 U/L (ref 0–37)
Albumin: 4.6 g/dL (ref 3.5–5.2)
Alkaline Phosphatase: 95 U/L (ref 39–117)
Bilirubin, Direct: 0 mg/dL (ref 0.0–0.3)
Total Bilirubin: 0.6 mg/dL (ref 0.2–1.2)
Total Protein: 7.5 g/dL (ref 6.0–8.3)

## 2018-09-22 LAB — BASIC METABOLIC PANEL
BUN: 31 mg/dL — ABNORMAL HIGH (ref 6–23)
CO2: 24 mEq/L (ref 19–32)
Calcium: 10 mg/dL (ref 8.4–10.5)
Chloride: 101 mEq/L (ref 96–112)
Creatinine, Ser: 1.61 mg/dL — ABNORMAL HIGH (ref 0.40–1.50)
GFR: 53.41 mL/min — ABNORMAL LOW (ref >60.00)
Glucose, Bld: 395 mg/dL — ABNORMAL HIGH (ref 70–99)
Potassium: 4.1 mEq/L (ref 3.5–5.1)
Sodium: 134 mEq/L — ABNORMAL LOW (ref 135–145)

## 2018-09-22 LAB — LIPID PANEL
Cholesterol: 157 mg/dL (ref 0–200)
HDL: 24.6 mg/dL — ABNORMAL LOW (ref >39.00)
NonHDL: 132.24
Total CHOL/HDL Ratio: 6
Triglycerides: 312 mg/dL — ABNORMAL HIGH (ref 0.0–149.0)
VLDL: 62.4 mg/dL — ABNORMAL HIGH (ref 0.0–40.0)

## 2018-09-22 LAB — LDL CHOLESTEROL, DIRECT: Direct LDL: 71 mg/dL

## 2018-09-22 NOTE — Telephone Encounter (Signed)
-----   Message from Biagio Borg, MD sent at 09/22/2018 11:55 AM EDT ----- John Dalton to contact pt - kidney function is slower than usual  OK to stop the HCT fluid pill, drink more fluids in the next 2 days and we'll repeat the kidney tests at his visit 8/27

## 2018-09-22 NOTE — Telephone Encounter (Signed)
Pt has been informed of results and expressed understanding.  °

## 2018-09-23 LAB — HEMOGLOBIN A1C
Hgb A1c MFr Bld: 12.7 % of total Hgb — ABNORMAL HIGH (ref <5.7)
Mean Plasma Glucose: 318 (calc)
eAG (mmol/L): 17.6 (calc)

## 2018-09-24 ENCOUNTER — Ambulatory Visit (INDEPENDENT_AMBULATORY_CARE_PROVIDER_SITE_OTHER): Payer: Medicare HMO | Admitting: Internal Medicine

## 2018-09-24 ENCOUNTER — Encounter: Payer: Self-pay | Admitting: Internal Medicine

## 2018-09-24 ENCOUNTER — Other Ambulatory Visit: Payer: Self-pay

## 2018-09-24 VITALS — BP 140/82 | HR 94 | Temp 98.9°F | Ht 70.0 in | Wt 220.6 lb

## 2018-09-24 DIAGNOSIS — N183 Chronic kidney disease, stage 3 unspecified: Secondary | ICD-10-CM

## 2018-09-24 DIAGNOSIS — Z0000 Encounter for general adult medical examination without abnormal findings: Secondary | ICD-10-CM

## 2018-09-24 DIAGNOSIS — E1142 Type 2 diabetes mellitus with diabetic polyneuropathy: Secondary | ICD-10-CM

## 2018-09-24 DIAGNOSIS — E538 Deficiency of other specified B group vitamins: Secondary | ICD-10-CM

## 2018-09-24 DIAGNOSIS — E611 Iron deficiency: Secondary | ICD-10-CM | POA: Diagnosis not present

## 2018-09-24 DIAGNOSIS — I10 Essential (primary) hypertension: Secondary | ICD-10-CM

## 2018-09-24 DIAGNOSIS — E559 Vitamin D deficiency, unspecified: Secondary | ICD-10-CM | POA: Diagnosis not present

## 2018-09-24 MED ORDER — NOVOLIN 70/30 RELION (70-30) 100 UNIT/ML ~~LOC~~ SUSP: SUBCUTANEOUS | 11 refills | Status: DC

## 2018-09-24 MED ORDER — ROSUVASTATIN CALCIUM 40 MG PO TABS: 40.0000 mg | ORAL_TABLET | Freq: Every day | ORAL | 3 refills | Status: DC

## 2018-09-24 MED ORDER — AMLODIPINE BESYLATE 10 MG PO TABS: 10.0000 mg | ORAL_TABLET | Freq: Every day | ORAL | 3 refills | Status: DC

## 2018-09-24 MED ORDER — CARVEDILOL 25 MG PO TABS: 37.5000 mg | ORAL_TABLET | Freq: Two times a day (BID) | ORAL | 3 refills | Status: DC

## 2018-09-24 MED ORDER — LISINOPRIL 40 MG PO TABS: 40.0000 mg | ORAL_TABLET | Freq: Every day | ORAL | 3 refills | Status: DC

## 2018-09-24 MED ORDER — GABAPENTIN 600 MG PO TABS: 1200.0000 mg | ORAL_TABLET | Freq: Three times a day (TID) | ORAL | 1 refills | Status: DC

## 2018-09-24 MED ORDER — INSULIN ASPART 100 UNIT/ML ~~LOC~~ SOLN: 8.0000 [IU] | Freq: Three times a day (TID) | SUBCUTANEOUS | 11 refills | Status: DC

## 2018-09-24 NOTE — Assessment & Plan Note (Signed)
Uncontrolled, to restart BP med,  F/u BP at home

## 2018-09-24 NOTE — Assessment & Plan Note (Signed)
With worsening likely AKI, for restart meds, d/c HCT, f/u lab next monday

## 2018-09-24 NOTE — Progress Notes (Signed)
Subjective:    Patient ID: John ArgyleLawrence Dalton, John Dalton    DOB: 10/17/1960, 58 y.o.   MRN: [id number]  HPI  Here to f/u; overall doing ok,  Pt denies chest pain, increasing sob or doe, wheezing, orthopnea, PND, increased LE swelling, palpitations, dizziness or syncope.  Pt denies new neurological symptoms such as new headache, or facial or extremity weakness or numbness.  Pt denies polydipsia, polyuria, or low sugar episode.  Pt states overall good compliance with meds, mostly trying to follow appropriate diet, with wt overall stable,  but little exercise however.  Admits to not taking most/all of his meds for about 1 mo, including the insulin; ready to restart today Does not want DM education, endo, or counseling referral at this time.  Has been under much stress as mother, aunt, and wife all died in last 2 months, and brother now has stage 4 prostate ca.   Past Medical History:  Diagnosis Date  . Allergy   . Diabetes mellitus (HCC)   . Diverticulitis   . Environmental and seasonal allergies   . 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 HTN (hypertension)   . Hypercholesteremia   . Neuropathy 01/11/2017   Past Surgical History:  Procedure Laterality Date  . Colon resection  2006   due to diverticulitis  . LEFT HEART CATH AND CORONARY ANGIOGRAPHY N/A 04/23/2018   Procedure: LEFT HEART CATH AND CORONARY ANGIOGRAPHY;  Surgeon: Lennette BihariKelly, Thomas A, MD;  Location: [facility];  Service: Cardiovascular;  Laterality: N/A;    reports that he has been smoking cigarettes. He has been smoking about 0.00 packs per day for the past 20.00 years. He has never used smokeless tobacco. He reports that he does not drink alcohol or use drugs. family history includes Diabetes in his father, paternal grandfather, and paternal grandmother; Hypertension in his maternal grandfather, maternal grandmother, mother, paternal grandfather, paternal grandmother, and sister. Allergies   Allergen Reactions  . Metformin And Related Diarrhea   Current Outpatient Medications on File Prior to Visit  Medication Sig Dispense Refill  . acetaminophen (TYLENOL) 325 MG tablet Take 2 tablets (650 mg total) by mouth every 4 (four) hours as needed for headache or mild pain. 30 tablet 0  . aspirin EC 81 MG tablet Take 81 mg by mouth daily.    Marland Kitchen. glucose blood (ACCU-CHEK AVIVA) test strip Use to check blood sugar 2-3 times a day. E11.9 100 each 12  . Insulin Syringe-Needle U-100 (BD INSULIN SYRINGE ULTRAFINE) 31G X 15/64" 0.5 ML MISC Use 1 needle per injection to inject insulin 1-4 times daily as instructed. 100 each 11  . Multiple Vitamins-Minerals (CENTRUM ADULTS PO) Take 1 tablet daily by mouth.     No current facility-administered medications on file prior to visit.    Review of Systems  Constitutional: Negative for other unusual diaphoresis or sweats HENT: Negative for ear discharge or swelling Eyes: Negative for other worsening visual disturbances Respiratory: Negative for stridor or other swelling  Gastrointestinal: Negative for worsening distension or other blood Genitourinary: Negative for retention or other urinary change Musculoskeletal: Negative for other MSK pain or swelling Skin: Negative for color change or other new lesions Neurological: Negative for worsening tremors and other numbness  Psychiatric/Behavioral: Negative for worsening agitation or other fatigue All other system neg per pt    Objective:   Physical Exam BP 140/82 (BP Location: Left Arm, Patient Position: Sitting, Cuff Size: Normal)  Pulse 94   Temp 98.9 F (37.2 C) (Oral)   Ht 5\' 10"  (1.778 m)   Wt 220 lb 9.6 oz (100.1 kg)   SpO2 97%   BMI 31.65 kg/m  VS noted,  Constitutional: Pt appears in NAD HENT: Head: NCAT.  Right Ear: External ear normal.  Left Ear: External ear normal.  Eyes: . Pupils are equal, round, and reactive to light. Conjunctivae and EOM are normal Nose: without d/c or  deformity Neck: Neck supple. Gross normal ROM Cardiovascular: Normal rate and regular rhythm.   Pulmonary/Chest: Effort normal and breath sounds without rales or wheezing.  Abd:  Soft, NT, ND, + BS, no organomegaly Neurological: Pt is alert. At baseline orientation, motor grossly intact Skin: Skin is warm. No rashes, other new lesions, no LE edema Psychiatric: Pt behavior is normal without agitation , nervous No other exam findings Lab Results  Component Value Date   WBC 5.3 04/24/2018   HGB 12.3 (L) 04/24/2018   HCT 36.3 (L) 04/24/2018   PLT 147 (L) 04/24/2018   GLUCOSE 395 (H) 09/22/2018   CHOL 157 09/22/2018   TRIG 312.0 (H) 09/22/2018   HDL 24.60 (L) 09/22/2018   LDLDIRECT 71.0 09/22/2018   LDLCALC 110 (H) 04/21/2018   ALT 15 09/22/2018   AST 12 09/22/2018   NA 134 (L) 09/22/2018   K 4.1 09/22/2018   CL 101 09/22/2018   CREATININE 1.61 (H) 09/22/2018   BUN 31 (H) 09/22/2018   CO2 24 09/22/2018   TSH 1.43 03/17/2018   PSA 1.86 03/17/2018   HGBA1C 12.7 (H) 09/22/2018   MICROALBUR 6.2 (H) 03/17/2018      Assessment & Plan:

## 2018-09-24 NOTE — Assessment & Plan Note (Signed)
Severe uncontrolled, to restart insulin, fu a1c next visit

## 2018-09-24 NOTE — Patient Instructions (Signed)
Please continue all other medications as before, and refills have been done if requested - all of your meds  Please call if you change your mind about treatment for depression, counseling, Diabetes education, or Endocrinology  Please have the pharmacy call with any other refills you may need.  Please continue your efforts at being more active, low cholesterol diet, and weight control  Please keep your appointments with your specialists as you may have planned  Please return in 6 months, or sooner if needed, with Lab testing done 3-5 days before

## 2018-09-28 ENCOUNTER — Telehealth: Payer: Self-pay | Admitting: Internal Medicine

## 2018-09-28 MED ORDER — ACCU-CHEK AVIVA PLUS VI STRP: ORAL_STRIP | 11 refills | Status: DC

## 2018-09-28 NOTE — Telephone Encounter (Signed)
Order a new script per pharmacy

## 2018-09-28 NOTE — Telephone Encounter (Signed)
glucose blood (ACCU-CHEK AVIVA) test strip  These were called in on 7/27 to [facility]/ pt said request [facility] resend  [place], [place] [phone] (Phone) [phone] (Fax)

## 2018-12-18 ENCOUNTER — Other Ambulatory Visit: Payer: Self-pay

## 2018-12-18 DIAGNOSIS — Z20822 Contact with and (suspected) exposure to covid-19: Secondary | ICD-10-CM

## 2018-12-21 LAB — NOVEL CORONAVIRUS, NAA: SARS-CoV-2, NAA: NOT DETECTED

## 2018-12-22 ENCOUNTER — Telehealth: Payer: Self-pay | Admitting: Internal Medicine

## 2018-12-22 NOTE — Telephone Encounter (Signed)
Negative COVID results given. Patient results "NOT Detected." Caller expressed understanding. ° °

## 2018-12-23 ENCOUNTER — Telehealth: Payer: Self-pay | Admitting: *Deleted

## 2018-12-23 NOTE — Telephone Encounter (Signed)
Patient called and given negative covid results . 

## 2018-12-28 ENCOUNTER — Other Ambulatory Visit: Payer: Self-pay

## 2018-12-28 DIAGNOSIS — E1142 Type 2 diabetes mellitus with diabetic polyneuropathy: Secondary | ICD-10-CM

## 2018-12-28 MED ORDER — LISINOPRIL 40 MG PO TABS: 40.0000 mg | ORAL_TABLET | Freq: Every day | ORAL | 3 refills | Status: DC

## 2018-12-28 MED ORDER — CARVEDILOL 25 MG PO TABS: 37.5000 mg | ORAL_TABLET | Freq: Two times a day (BID) | ORAL | 3 refills | Status: DC

## 2018-12-28 MED ORDER — BLOOD GLUCOSE METER KIT: PACK | 0 refills | Status: AC

## 2018-12-28 MED ORDER — AMLODIPINE BESYLATE 10 MG PO TABS: 10.0000 mg | ORAL_TABLET | Freq: Every day | ORAL | 3 refills | Status: DC

## 2018-12-28 MED ORDER — ROSUVASTATIN CALCIUM 40 MG PO TABS: 40.0000 mg | ORAL_TABLET | Freq: Every day | ORAL | 3 refills | Status: DC

## 2018-12-28 MED ORDER — GABAPENTIN 600 MG PO TABS: 1200.0000 mg | ORAL_TABLET | Freq: Three times a day (TID) | ORAL | 1 refills | Status: DC

## 2018-12-28 NOTE — Telephone Encounter (Signed)
This rx is always forced to be printed  I have printed and forwarded to shirron to fax

## 2018-12-28 NOTE — Telephone Encounter (Signed)
This is the first mention that I have received in regard to med refills. Will send refills in to the mail order pharmacy.   Pt also requested a new meter sent to [facility] as well. I have dropped it. Please review and sign off. Thanks!  Copied from [place] [id number]. Topic: General - Inquiry >> Dec 28, 2018 12:22 PM John Dalton, [place] wrote: Reason for CRM: Patient called in stating he would like all his medication sent to [facility]. Their phone number is [phone]. Pt states they have reached out to practice with no response on getting his medications over to their pharmacy for all medications, except his 7:30am insulin. Patient would like this taken care of ASAP as he is running low on some medications.

## 2018-12-29 NOTE — Telephone Encounter (Signed)
Faxed

## 2019-02-01 ENCOUNTER — Other Ambulatory Visit: Payer: Self-pay | Admitting: Internal Medicine

## 2019-03-23 ENCOUNTER — Other Ambulatory Visit (INDEPENDENT_AMBULATORY_CARE_PROVIDER_SITE_OTHER): Payer: Medicare HMO

## 2019-03-23 ENCOUNTER — Other Ambulatory Visit: Payer: Medicare HMO

## 2019-03-23 DIAGNOSIS — E1142 Type 2 diabetes mellitus with diabetic polyneuropathy: Secondary | ICD-10-CM

## 2019-03-23 DIAGNOSIS — E611 Iron deficiency: Secondary | ICD-10-CM

## 2019-03-23 DIAGNOSIS — E559 Vitamin D deficiency, unspecified: Secondary | ICD-10-CM | POA: Diagnosis not present

## 2019-03-23 DIAGNOSIS — Z0000 Encounter for general adult medical examination without abnormal findings: Secondary | ICD-10-CM

## 2019-03-23 DIAGNOSIS — E538 Deficiency of other specified B group vitamins: Secondary | ICD-10-CM | POA: Diagnosis not present

## 2019-03-23 LAB — BASIC METABOLIC PANEL
BUN: 18 mg/dL (ref 6–23)
CO2: 26 mEq/L (ref 19–32)
Calcium: 9.7 mg/dL (ref 8.4–10.5)
Chloride: 108 mEq/L (ref 96–112)
Creatinine, Ser: 1.11 mg/dL (ref 0.40–1.50)
GFR: 81.89 mL/min (ref >60.00)
Glucose, Bld: 66 mg/dL — ABNORMAL LOW (ref 70–99)
Potassium: 3.8 mEq/L (ref 3.5–5.1)
Sodium: 145 mEq/L (ref 135–145)

## 2019-03-23 LAB — CBC WITH DIFFERENTIAL/PLATELET
Basophils Absolute: 0 10*3/uL (ref 0.0–0.1)
Basophils Relative: 0.5 % (ref 0.0–3.0)
Eosinophils Absolute: 0.1 10*3/uL (ref 0.0–0.7)
Eosinophils Relative: 0.9 % (ref 0.0–5.0)
HCT: 39.2 % (ref 39.0–52.0)
Hemoglobin: 13.2 g/dL (ref 13.0–17.0)
Lymphocytes Relative: 25.5 % (ref 12.0–46.0)
Lymphs Abs: 1.7 10*3/uL (ref 0.7–4.0)
MCHC: 33.7 g/dL (ref 30.0–36.0)
MCV: 81.2 fl (ref 78.0–100.0)
Monocytes Absolute: 0.7 10*3/uL (ref 0.1–1.0)
Monocytes Relative: 9.9 % (ref 3.0–12.0)
Neutro Abs: 4.3 10*3/uL (ref 1.4–7.7)
Neutrophils Relative %: 63.2 % (ref 43.0–77.0)
Platelets: 228 10*3/uL (ref 150.0–400.0)
RBC: 4.83 Mil/uL (ref 4.22–5.81)
RDW: 15.5 % (ref 11.5–15.5)
WBC: 6.8 10*3/uL (ref 4.0–10.5)

## 2019-03-23 LAB — URINALYSIS, ROUTINE W REFLEX MICROSCOPIC
Bilirubin Urine: NEGATIVE
Hgb urine dipstick: NEGATIVE
Ketones, ur: NEGATIVE
Leukocytes,Ua: NEGATIVE
Nitrite: NEGATIVE
RBC / HPF: NONE SEEN (ref 0–?)
Specific Gravity, Urine: 1.015 (ref 1.000–1.030)
Total Protein, Urine: NEGATIVE
Urine Glucose: NEGATIVE
Urobilinogen, UA: 0.2 (ref 0.0–1.0)
pH: 6 (ref 5.0–8.0)

## 2019-03-23 LAB — MICROALBUMIN / CREATININE URINE RATIO
Creatinine,U: 121.4 mg/dL
Microalb Creat Ratio: 4.2 mg/g (ref 0.0–30.0)
Microalb, Ur: 5.1 mg/dL — ABNORMAL HIGH (ref 0.0–1.9)

## 2019-03-23 LAB — PSA: PSA: 1.42 ng/mL (ref 0.10–4.00)

## 2019-03-23 LAB — HEPATIC FUNCTION PANEL
ALT: 20 U/L (ref 0–53)
AST: 15 U/L (ref 0–37)
Albumin: 4.3 g/dL (ref 3.5–5.2)
Alkaline Phosphatase: 101 U/L (ref 39–117)
Bilirubin, Direct: 0.1 mg/dL (ref 0.0–0.3)
Total Bilirubin: 0.7 mg/dL (ref 0.2–1.2)
Total Protein: 6.9 g/dL (ref 6.0–8.3)

## 2019-03-23 LAB — VITAMIN B12: Vitamin B-12: 627 pg/mL (ref 211–911)

## 2019-03-23 LAB — TSH: TSH: 1.54 u[IU]/mL (ref 0.35–4.50)

## 2019-03-23 LAB — IBC PANEL
Iron: 53 ug/dL (ref 42–165)
Saturation Ratios: 16 % — ABNORMAL LOW (ref 20.0–50.0)
Transferrin: 237 mg/dL (ref 212.0–360.0)

## 2019-03-23 LAB — LIPID PANEL
Cholesterol: 151 mg/dL (ref 0–200)
HDL: 36.9 mg/dL — ABNORMAL LOW (ref >39.00)
LDL Cholesterol: 93 mg/dL (ref 0–99)
NonHDL: 114.19
Total CHOL/HDL Ratio: 4
Triglycerides: 107 mg/dL (ref 0.0–149.0)
VLDL: 21.4 mg/dL (ref 0.0–40.0)

## 2019-03-23 LAB — VITAMIN D 25 HYDROXY (VIT D DEFICIENCY, FRACTURES): VITD: 18.66 ng/mL — ABNORMAL LOW (ref 30.00–100.00)

## 2019-03-24 LAB — HEMOGLOBIN A1C
Hgb A1c MFr Bld: 9.9 % of total Hgb — ABNORMAL HIGH (ref <5.7)
Mean Plasma Glucose: 237 (calc)
eAG (mmol/L): 13.2 (calc)

## 2019-03-26 ENCOUNTER — Other Ambulatory Visit: Payer: Self-pay

## 2019-03-26 ENCOUNTER — Ambulatory Visit (INDEPENDENT_AMBULATORY_CARE_PROVIDER_SITE_OTHER): Payer: Medicare HMO | Admitting: Internal Medicine

## 2019-03-26 ENCOUNTER — Encounter: Payer: Self-pay | Admitting: Internal Medicine

## 2019-03-26 VITALS — BP 130/82 | HR 80 | Temp 98.0°F | Ht 70.0 in | Wt 242.0 lb

## 2019-03-26 DIAGNOSIS — E559 Vitamin D deficiency, unspecified: Secondary | ICD-10-CM | POA: Diagnosis not present

## 2019-03-26 DIAGNOSIS — M79642 Pain in left hand: Secondary | ICD-10-CM

## 2019-03-26 DIAGNOSIS — G4700 Insomnia, unspecified: Secondary | ICD-10-CM | POA: Diagnosis not present

## 2019-03-26 DIAGNOSIS — I10 Essential (primary) hypertension: Secondary | ICD-10-CM | POA: Diagnosis not present

## 2019-03-26 DIAGNOSIS — E782 Mixed hyperlipidemia: Secondary | ICD-10-CM | POA: Diagnosis not present

## 2019-03-26 DIAGNOSIS — E1142 Type 2 diabetes mellitus with diabetic polyneuropathy: Secondary | ICD-10-CM | POA: Diagnosis not present

## 2019-03-26 DIAGNOSIS — M79641 Pain in right hand: Secondary | ICD-10-CM

## 2019-03-26 DIAGNOSIS — Z0001 Encounter for general adult medical examination with abnormal findings: Secondary | ICD-10-CM | POA: Diagnosis not present

## 2019-03-26 DIAGNOSIS — H6123 Impacted cerumen, bilateral: Secondary | ICD-10-CM | POA: Diagnosis not present

## 2019-03-26 MED ORDER — NOVOLIN 70/30 RELION (70-30) 100 UNIT/ML ~~LOC~~ SUSP: SUBCUTANEOUS | 11 refills | Status: AC

## 2019-03-26 MED ORDER — AMLODIPINE BESYLATE 10 MG PO TABS: 10.0000 mg | ORAL_TABLET | Freq: Every day | ORAL | 3 refills | Status: AC

## 2019-03-26 MED ORDER — "INSULIN SYRINGE-NEEDLE U-100 31G X 15/64"" 0.5 ML MISC": 11 refills | Status: AC

## 2019-03-26 MED ORDER — INSULIN ASPART 100 UNIT/ML ~~LOC~~ SOLN: 8.0000 [IU] | Freq: Three times a day (TID) | SUBCUTANEOUS | 11 refills | Status: AC

## 2019-03-26 MED ORDER — CARVEDILOL 25 MG PO TABS: 37.5000 mg | ORAL_TABLET | Freq: Two times a day (BID) | ORAL | 3 refills | Status: DC

## 2019-03-26 MED ORDER — LISINOPRIL 40 MG PO TABS: 40.0000 mg | ORAL_TABLET | Freq: Every day | ORAL | 3 refills | Status: AC

## 2019-03-26 MED ORDER — ROSUVASTATIN CALCIUM 40 MG PO TABS: 40.0000 mg | ORAL_TABLET | Freq: Every day | ORAL | 3 refills | Status: AC

## 2019-03-26 MED ORDER — VITAMIN D (ERGOCALCIFEROL) 1.25 MG (50000 UNIT) PO CAPS: 50000.0000 [IU] | ORAL_CAPSULE | ORAL | 0 refills | Status: AC

## 2019-03-26 MED ORDER — ZOLPIDEM TARTRATE 10 MG PO TABS: 10.0000 mg | ORAL_TABLET | Freq: Every evening | ORAL | 1 refills | Status: AC | PRN

## 2019-03-26 MED ORDER — GABAPENTIN 600 MG PO TABS: 1200.0000 mg | ORAL_TABLET | Freq: Three times a day (TID) | ORAL | 1 refills | Status: AC

## 2019-03-26 NOTE — Patient Instructions (Addendum)
You are on the Covid vaccine wait list  You will be contacted regarding the referral for: eye doctor  Please take Vitamin D 30092 units weekly for 12 weeks, then plan to change to OTC Vitamin D3 at 2000 units per day, indefinitely.  Ok to use Voltaren gel OTC as needed for the hand pain  Ok to increase the 70/30 insulin to 90 units twice per day  Ok to try the OTC Melatonin 2mg  (or up to 10 mg if needed) for sleep  Please take all new medication as prescribed - the ambien as needed for sleep  Your ears were irrigated of wax today  Please continue all other medications as before, and refills have been done if requested.  Please have the pharmacy call with any other refills you may need.  Please continue your efforts at being more active, low cholesterol diet, and weight control.  You are otherwise up to date with prevention measures today.  Please keep your appointments with your specialists as you may have planned  Please make an Appointment to return in 6 months, or sooner if needed, also with Lab Appointment for testing done 3-5 days before at the FIRST FLOOR Lab (so this is for TWO appointments - please see the scheduling desk as you leave)

## 2019-03-26 NOTE — Progress Notes (Signed)
Patient consent obtained. Irrigation with water and peroxide performed. Full view of left tympanic membranes after procedure. Not able to view the right side to impacted cerumen against the tympanic membrane.  Patient tolerated procedure well.

## 2019-03-26 NOTE — Progress Notes (Signed)
Subjective:    Patient ID: John Dalton, male    DOB: 1960-06-12, 59 y.o.   MRN: [id number]  HPI. Here for wellness and f/u;  Overall doing ok;  Pt denies Chest pain, worsening SOB, DOE, wheezing, orthopnea, PND, worsening LE edema, palpitations, dizziness or syncope.  Pt denies neurological change such as new headache, facial or extremity weakness.  Pt denies polydipsia, polyuria, or low sugar symptoms. Pt states overall good compliance with treatment and medications, good tolerability, and has been trying to follow appropriate diet.  Pt denies worsening depressive symptoms, suicidal ideation or panic. No fever, night sweats, wt loss, loss of appetite, or other constitutional symptoms.  Pt states good ability with ADL's, has low fall risk, home safety reviewed and adequate, no other significant changes in hearing or vision, and only occasionally active with exercise. Also with bilateral reduced hearing ? Due to wax impactions.  C/o worsening insomnia for several months, also c/o bilateral hand OA related pain and stiffness  Past Medical History:  Diagnosis Date  . Allergy   . Diabetes mellitus ([place])   . Diverticulitis   . Environmental and seasonal allergies   . 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 HTN (hypertension)   . Hypercholesteremia   . Neuropathy 01/11/2017   Past Surgical History:  Procedure Laterality Date  . Colon resection  2006   due to diverticulitis  . LEFT HEART CATH AND CORONARY ANGIOGRAPHY N/A 04/23/2018   Procedure: LEFT HEART CATH AND CORONARY ANGIOGRAPHY;  Surgeon: Troy Sine, MD;  Location: [place];  Service: Cardiovascular;  Laterality: N/A;    reports that he has been smoking cigarettes. He has been smoking about 0.00 packs per day for the past 20.00 years. He has never used smokeless tobacco. He reports that he does not drink alcohol or use drugs. family history includes Diabetes in his father, paternal  grandfather, and paternal grandmother; Hypertension in his maternal grandfather, maternal grandmother, mother, paternal grandfather, paternal grandmother, and sister. Allergies  Allergen Reactions  . Metformin And Related Diarrhea   Current Outpatient Medications on File Prior to Visit  Medication Sig Dispense Refill  . Accu-Chek Softclix Lancets lancets USE TO TEST BLOOD SUGAR UP TO TWICE DAILY AS DIRECTED 100 each 3  . acetaminophen (TYLENOL) 325 MG tablet Take 2 tablets (650 mg total) by mouth every 4 (four) hours as needed for headache or mild pain. 30 tablet 0  . aspirin EC 81 MG tablet Take 81 mg by mouth daily.    . blood glucose meter kit and supplies Dispense based on patient and insurance preference. Use up to two times daily as directed. (FOR ICD-10 E10.9, E11.9). 1 each 0  . glucose blood (ACCU-CHEK AVIVA PLUS) test strip USE TO TEST BLOOD SUGAR UP TO TWICE DAILY AS DIRECTED 100 strip 3  . glucose blood (ACCU-CHEK AVIVA) test strip Use to check blood sugar 2-3 times a day. E11.9 100 each 12  . Multiple Vitamins-Minerals (CENTRUM ADULTS PO) Take 1 tablet daily by mouth.     No current facility-administered medications on file prior to visit.   Review of Systems All otherwise neg per pt     Objective:   Physical Exam BP 130/82   Pulse 80   Temp 98 F (36.7 C)   Ht _0  (1.778 m)   Wt 242 lb (109.8 kg)   SpO2 99%   BMI 34.72 kg/m  VS noted,  Constitutional: Pt appears in NAD HENT: Head: NCAT.  Right Ear: External ear normal.  Left Ear: External ear normal.  Eyes: . Pupils are equal, round, and reactive to light. Conjunctivae and EOM are normal Bilateral canals with wax impactions cleared except for persistent hard mild impaction on the right with persistent hearing loss Nose: without d/c or deformity Neck: Neck supple. Gross normal ROM Cardiovascular: Normal rate and regular rhythm.   Pulmonary/Chest: Effort normal and breath sounds without rales or wheezing.   Abd:  Soft, NT, ND, + BS, no organomegaly Neurological: Pt is alert. At baseline orientation, motor grossly intact Skin: Skin is warm. No rashes, other new lesions, no LE edema Psychiatric: Pt behavior is normal without agitation  All otherwise neg per pt Lab Results  Component Value Date   WBC 6.8 03/23/2019   HGB 13.2 03/23/2019   HCT 39.2 03/23/2019   PLT 228.0 03/23/2019   GLUCOSE 66 (L) 03/23/2019   CHOL 151 03/23/2019   TRIG 107.0 03/23/2019   HDL 36.90 (L) 03/23/2019   LDLDIRECT 71.0 09/22/2018   LDLCALC 93 03/23/2019   ALT 20 03/23/2019   AST 15 03/23/2019   NA 145 03/23/2019   K 3.8 03/23/2019   CL 108 03/23/2019   CREATININE 1.11 03/23/2019   BUN 18 03/23/2019   CO2 26 03/23/2019   TSH 1.54 03/23/2019   PSA 1.42 03/23/2019   HGBA1C 9.9 (H) 03/23/2019   MICROALBUR 5.1 (H) 03/23/2019      Assessment & Plan:

## 2019-03-28 ENCOUNTER — Encounter: Payer: Self-pay | Admitting: Internal Medicine

## 2019-03-28 DIAGNOSIS — E559 Vitamin D deficiency, unspecified: Secondary | ICD-10-CM | POA: Insufficient documentation

## 2019-03-28 NOTE — Assessment & Plan Note (Signed)
Ok to try otc melatonin 2-3 mg prn, or ambien qhs prn if not improvee

## 2019-03-28 NOTE — Assessment & Plan Note (Signed)
For vit d oral replacement

## 2019-03-28 NOTE — Assessment & Plan Note (Signed)
stable overall by history and exam, recent data reviewed with pt, and pt to continue medical treatment as before,  to f/u any worsening symptoms or concerns  

## 2019-03-28 NOTE — Assessment & Plan Note (Signed)

## 2019-03-28 NOTE — Assessment & Plan Note (Addendum)
With persistent on right , for ent referral  I spent 31 minutes in addition to time for wellness examination in preparing to see the patient by review of recent labs, imaging and procedures, obtaining and reviewing separately obtained history, communicating with the patient and family or caregiver, ordering medications, tests or procedures, and documenting clinical information in the EHR including the differential Dx, treatment, and any further evaluation and other management of bilateral cerumen impaction, insomnia, vit d def, HLD, HTN, DM , bilat hand pain

## 2019-03-28 NOTE — Assessment & Plan Note (Signed)
For voltaren gel prn 

## 2019-03-29 ENCOUNTER — Other Ambulatory Visit: Payer: Self-pay

## 2019-03-29 NOTE — Telephone Encounter (Signed)
Patient states that his pharmacy did not receive the prescription for zolpidem (AMBIEN) 10 MG tablet   that was recently sent. Would like to know if this could be resent to [facility]? Please advise.

## 2019-03-30 NOTE — Telephone Encounter (Signed)
Rx was sent to [facility] on 03/26/2019.

## 2019-04-13 ENCOUNTER — Encounter: Payer: Self-pay | Admitting: Internal Medicine

## 2019-05-10 ENCOUNTER — Encounter (HOSPITAL_COMMUNITY): Payer: Self-pay

## 2019-05-10 ENCOUNTER — Other Ambulatory Visit: Payer: Self-pay

## 2019-05-10 ENCOUNTER — Emergency Department (HOSPITAL_COMMUNITY)
Admission: EM | Admit: 2019-05-10 | Discharge: 2019-05-10 | Disposition: A | Discharge status: Home / Self Care | Payer: Medicare HMO | Attending: Emergency Medicine | Admitting: Emergency Medicine

## 2019-05-10 DIAGNOSIS — M79642 Pain in left hand: Secondary | ICD-10-CM | POA: Diagnosis not present

## 2019-05-10 DIAGNOSIS — Z794 Long term (current) use of insulin: Secondary | ICD-10-CM | POA: Diagnosis not present

## 2019-05-10 DIAGNOSIS — I129 Hypertensive chronic kidney disease with stage 1 through stage 4 chronic kidney disease, or unspecified chronic kidney disease: Secondary | ICD-10-CM | POA: Diagnosis not present

## 2019-05-10 DIAGNOSIS — E1122 Type 2 diabetes mellitus with diabetic chronic kidney disease: Secondary | ICD-10-CM | POA: Insufficient documentation

## 2019-05-10 DIAGNOSIS — E1140 Type 2 diabetes mellitus with diabetic neuropathy, unspecified: Secondary | ICD-10-CM | POA: Diagnosis not present

## 2019-05-10 DIAGNOSIS — Z7982 Long term (current) use of aspirin: Secondary | ICD-10-CM | POA: Diagnosis not present

## 2019-05-10 DIAGNOSIS — Z79899 Other long term (current) drug therapy: Secondary | ICD-10-CM | POA: Insufficient documentation

## 2019-05-10 DIAGNOSIS — M62830 Muscle spasm of back: Secondary | ICD-10-CM | POA: Insufficient documentation

## 2019-05-10 DIAGNOSIS — F17210 Nicotine dependence, cigarettes, uncomplicated: Secondary | ICD-10-CM | POA: Diagnosis not present

## 2019-05-10 DIAGNOSIS — N183 Chronic kidney disease, stage 3 unspecified: Secondary | ICD-10-CM | POA: Insufficient documentation

## 2019-05-10 DIAGNOSIS — M546 Pain in thoracic spine: Secondary | ICD-10-CM | POA: Diagnosis present

## 2019-05-10 DIAGNOSIS — M79641 Pain in right hand: Secondary | ICD-10-CM | POA: Insufficient documentation

## 2019-05-10 DIAGNOSIS — I2510 Atherosclerotic heart disease of native coronary artery without angina pectoris: Secondary | ICD-10-CM | POA: Insufficient documentation

## 2019-05-10 DIAGNOSIS — M2559 Pain in other specified joint: Secondary | ICD-10-CM | POA: Diagnosis not present

## 2019-05-10 MED ORDER — CYCLOBENZAPRINE HCL 10 MG PO TABS: 10.0000 mg | ORAL_TABLET | Freq: Two times a day (BID) | ORAL | 0 refills | Status: DC | PRN

## 2019-05-10 MED ORDER — HYDROCODONE-ACETAMINOPHEN 5-325 MG PO TABS
1.0000 | ORAL_TABLET | Freq: Once | ORAL | Status: AC
  Administered 2019-05-10: 08:00:00 1 via ORAL
  Filled 2019-05-10: qty 1

## 2019-05-10 MED ORDER — METHYLPREDNISOLONE 4 MG PO TBPK: ORAL_TABLET | ORAL | 0 refills | Status: DC

## 2019-05-10 NOTE — ED Provider Notes (Signed)
[facility] DEPT Provider Note   CSN: [id number] Arrival date & time: 05/10/19  0645     History Chief Complaint  Patient presents with  . Joint Pain  . Back Pain    John Dalton is a 59 y.o. male.  The history is provided by the patient.  Back Pain Location:  Thoracic spine Quality:  Aching Radiates to:  Does not radiate Pain severity:  Mild Pain is:  Worse during the day Onset quality:  Gradual Timing:  Constant Progression:  Unchanged Context: not recent injury   Relieved by:  NSAIDs Worsened by:  Movement Ineffective treatments:  Ibuprofen, NSAIDs and OTC medications Associated symptoms: no abdominal pain, no chest pain, no dysuria, no fever, no numbness and no weakness   Associated symptoms comment:  Bilateral hand pain as well      Past Medical History:  Diagnosis Date  . Allergy   . Diabetes mellitus ([place])   . Diverticulitis   . Environmental and seasonal allergies   . 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 HTN (hypertension)   . Hypercholesteremia   . Neuropathy 01/11/2017    Patient Active Problem List   Diagnosis Date Noted  . Vitamin D deficiency 03/28/2019  . Insomnia 03/26/2019  . Bilateral hand pain 03/26/2019  . Chest pain 04/22/2018  . DKA (diabetic ketoacidoses) ([place]) 04/21/2018  . CAD (coronary artery disease) 04/21/2018  . Type 2 diabetes mellitus with hyperosmolar nonketotic hyperglycemia ([place]) 04/21/2018  . Intermittent left-sided chest pain 04/21/2018  . CKD (chronic kidney disease) stage 3, GFR 30-59 ml/min 03/26/2018  . Alopecia 06/13/2017  . Neuropathy 01/11/2017  . Erectile dysfunction 12/29/2015  . Abscess of back 12/11/2015  . Heart palpitations 09/25/2015  . Stye external 09/25/2015  . Type 2 diabetes mellitus ([place]) 08/16/2015  . Encounter for well adult exam with abnormal findings 08/16/2015  . Medicare annual wellness visit, subsequent 08/16/2015  .  Hypertension, essential 08/16/2015  . Hyperlipidemia 08/16/2015  . Obesity 08/16/2015  . Impacted cerumen of both ears 08/16/2015    Past Surgical History:  Procedure Laterality Date  . Colon resection  2006   due to diverticulitis  . LEFT HEART CATH AND CORONARY ANGIOGRAPHY N/A 04/23/2018   Procedure: LEFT HEART CATH AND CORONARY ANGIOGRAPHY;  Surgeon: Troy Sine, MD;  Location: [facility];  Service: Cardiovascular;  Laterality: N/A;       Family History  Problem Relation Age of Onset  . Diabetes Father   . Hypertension Mother   . Hypertension Sister   . Diabetes Paternal Grandmother   . Hypertension Paternal Grandmother   . Diabetes Paternal Grandfather   . Hypertension Paternal Grandfather   . Hypertension Maternal Grandmother   . Hypertension Maternal Grandfather     Social History   Tobacco Use  . Smoking status: Current Some Day Smoker    Packs/day: 0.00    Years: 20.00    Pack years: 0.00    Types: Cigarettes  . Smokeless tobacco: Never Used  . Tobacco comment: denies 04/21/2018  Substance Use Topics  . Alcohol use: No    Alcohol/week: 0.0 standard drinks  . Drug use: No    Home Medications Prior to Admission medications   Medication Sig Start Date End Date Taking? Authorizing Provider  Accu-Chek Softclix Lancets lancets USE TO TEST BLOOD SUGAR UP TO TWICE DAILY AS DIRECTED 02/01/19   Biagio Borg, MD  acetaminophen (TYLENOL)  325 MG tablet Take 2 tablets (650 mg total) by mouth every 4 (four) hours as needed for headache or mild pain. 04/24/18   Swayze, Ava, DO  amLODipine (NORVASC) 10 MG tablet Take 1 tablet (10 mg total) by mouth at bedtime. 03/26/19   Biagio Borg, MD  aspirin EC 81 MG tablet Take 81 mg by mouth daily.    [provider]  blood glucose meter kit and supplies Dispense based on patient and insurance preference. Use up to two times daily as directed. (FOR ICD-10 E10.9, E11.9). 12/28/18   Biagio Borg, MD  carvedilol  (COREG) 25 MG tablet Take 1.5 tablets (37.5 mg total) by mouth 2 (two) times daily with a meal. 03/26/19   Biagio Borg, MD  cyclobenzaprine (FLEXERIL) 10 MG tablet Take 1 tablet (10 mg total) by mouth 2 (two) times daily as needed for up to 20 doses for muscle spasms. 05/10/19   Hadasah Brugger, DO  gabapentin (NEURONTIN) 600 MG tablet Take 2 tablets (1,200 mg total) by mouth 3 (three) times daily. 03/26/19   Biagio Borg, MD  glucose blood (ACCU-CHEK AVIVA PLUS) test strip USE TO TEST BLOOD SUGAR UP TO TWICE DAILY AS DIRECTED 02/01/19   Biagio Borg, MD  glucose blood (ACCU-CHEK AVIVA) test strip Use to check blood sugar 2-3 times a day. E11.9 08/24/18   Biagio Borg, MD  insulin aspart (NOVOLOG) 100 UNIT/ML injection Inject 8 Units into the skin 3 (three) times daily with meals. 03/26/19   Biagio Borg, MD  insulin NPH-regular Human (NOVOLIN 70/30 RELION) (70-30) 100 UNIT/ML injection E11.9 INJECT 90 UNITS SUBCUTANEOUSLY TWICE DAILY WITH A MEAL 03/26/19   Biagio Borg, MD  Insulin Syringe-Needle U-100 (BD INSULIN SYRINGE ULTRAFINE) 31G X 15/64" 0.5 ML MISC Use 1 needle per injection to inject insulin 1-4 times daily as instructed. 03/26/19   Biagio Borg, MD  lisinopril (ZESTRIL) 40 MG tablet Take 1 tablet (40 mg total) by mouth daily. 03/26/19   Biagio Borg, MD  methylPREDNISolone (MEDROL DOSEPAK) 4 MG TBPK tablet Follow package insert 05/10/19   Telicia Hodgkiss, DO  Multiple Vitamins-Minerals (CENTRUM ADULTS PO) Take 1 tablet daily by mouth.    [provider]  rosuvastatin (CRESTOR) 40 MG tablet Take 1 tablet (40 mg total) by mouth daily. 03/26/19   Biagio Borg, MD  Vitamin D, Ergocalciferol, (DRISDOL) 1.25 MG (50000 UNIT) CAPS capsule Take 1 capsule (50,000 Units total) by mouth every 7 (seven) days. 03/26/19   Biagio Borg, MD  zolpidem (AMBIEN) 10 MG tablet Take 1 tablet (10 mg total) by mouth at bedtime as needed for sleep. 03/26/19   Biagio Borg, MD    Allergies    Gabapentin and  Metformin and related  Review of Systems   Review of Systems  Constitutional: Negative for chills and fever.  HENT: Negative for ear pain and sore throat.   Eyes: Negative for pain and visual disturbance.  Respiratory: Negative for cough and shortness of breath.   Cardiovascular: Negative for chest pain and palpitations.  Gastrointestinal: Negative for abdominal pain and vomiting.  Genitourinary: Negative for dysuria and hematuria.  Musculoskeletal: Positive for arthralgias and back pain.  Skin: Negative for color change and rash.  Neurological: Negative for seizures, syncope, weakness and numbness.  All other systems reviewed and are negative.   Physical Exam Updated Vital Signs  ED Triage Vitals  Enc Vitals Group     BP 05/10/19 0659 Marland Kitchen)  181/94     Pulse Rate 05/10/19 0659 92     Resp 05/10/19 0659 16     Temp 05/10/19 0659 98.1 F (36.7 C)     Temp Source 05/10/19 0659 Oral     SpO2 05/10/19 0659 98 %     Weight 05/10/19 0659 230 lb (104.3 kg)     Height 05/10/19 0659 '5\' 10"'  (1.778 m)     Head Circumference --      Peak Flow --      Pain Score 05/10/19 0656 9     Pain Loc --      Pain Edu? --      Excl. in [place]? --     Physical Exam Vitals and nursing note reviewed.  Constitutional:      General: He is not in acute distress.    Appearance: He is well-developed. He is not ill-appearing.  HENT:     Head: Normocephalic and atraumatic.     Nose: Nose normal.     Mouth/Throat:     Mouth: Mucous membranes are moist.  Eyes:     Extraocular Movements: Extraocular movements intact.     Conjunctiva/sclera: Conjunctivae normal.     Pupils: Pupils are equal, round, and reactive to light.  Cardiovascular:     Rate and Rhythm: Normal rate and regular rhythm.     Pulses: Normal pulses.     Heart sounds: Normal heart sounds. No murmur.  Pulmonary:     Effort: Pulmonary effort is normal. No respiratory distress.     Breath sounds: Normal breath sounds.  Abdominal:      General: Abdomen is flat. There is no distension.     Palpations: Abdomen is soft.     Tenderness: There is no abdominal tenderness.  Musculoskeletal:        General: Tenderness present. Normal range of motion.     Cervical back: Normal range of motion and neck supple. No tenderness.     Comments: Some tenderness to bilateral hands with no obvious deformity or swelling, increased tone to paraspinal muscles in the thoracic spine but no midline spinal pain  Skin:    General: Skin is warm and dry.     Capillary Refill: Capillary refill takes less than 2 seconds.  Neurological:     General: No focal deficit present.     Mental Status: He is alert.     Sensory: No sensory deficit.     Motor: No weakness.     Comments: 5+ out of 5 strength throughout, normal sensation, no drift  Psychiatric:        Mood and Affect: Mood normal.     ED Results / Procedures / Treatments   Labs (all labs ordered are listed, but only abnormal results are displayed) Labs Reviewed - No data to display  EKG None  Radiology No results found.  Procedures Procedures (including critical care time)  Medications Ordered in ED Medications  HYDROcodone-acetaminophen (NORCO/VICODIN) 5-325 MG per tablet 1 tablet (1 tablet Oral Given 05/10/19 0743)    ED Course  I have reviewed the triage vital signs and the nursing notes.  Pertinent labs & imaging results that were available during my care of the patient were reviewed by me and considered in my medical decision making (see chart for details).    MDM Rules/Calculators/A&P                      Ranier Coach is a 59 year old male with  history of hypertension, high cholesterol who presents to the ED with bilateral hand pain, back pain, joint pain throughout.  Patient recently saw primary care doctor and has been taking anti-inflammatories, Tylenol with minimal relief.  Denies any trauma.  Pain in both of his hands that gets worse throughout the day but better  in the morning.  Feels a tightness in his upper back.  Back seems consistent with a muscle spasm.  Increased tone to paraspinal muscles and upper thoracic spine.  Hands overall with no swelling.  Less likely rheumatoid arthritis.  Likely osteoarthritis.  Denies any chest pain, shortness of breath, no neck pain, no weakness, no numbness.  Recommend continued use of Tylenol Motrin and appropriate dosing discussed.  Will prescribe Solu-Medrol Dosepak and Flexeril as well.  Recommend follow-up with primary care doctor.  Discharged in good condition.  This chart was dictated using voice recognition software.  Despite best efforts to proofread,  errors can occur which can change the documentation meaning.     Final Clinical Impression(s) / ED Diagnoses Final diagnoses:  Pain in other joint  Back spasm    Rx / DC Orders ED Discharge Orders         Ordered    methylPREDNISolone (MEDROL DOSEPAK) 4 MG TBPK tablet     05/10/19 0743    cyclobenzaprine (FLEXERIL) 10 MG tablet  2 times daily PRN     05/10/19 0743           Lennice Sites, DO 05/10/19 [id number]

## 2019-05-10 NOTE — ED Triage Notes (Signed)
Patient arrived with complaints of hand, toes and back pain for the last month reporting coming in due to the pain increasing.

## 2019-05-10 NOTE — Discharge Instructions (Addendum)
Take 400 mg of Motrin 3 times a day, 500 mg of Tylenol 4 times a day for the next 5 days.  Take other medications as prescribed.

## 2019-05-21 ENCOUNTER — Other Ambulatory Visit: Payer: Self-pay

## 2019-05-21 ENCOUNTER — Encounter: Payer: Self-pay | Admitting: Internal Medicine

## 2019-05-21 ENCOUNTER — Ambulatory Visit (INDEPENDENT_AMBULATORY_CARE_PROVIDER_SITE_OTHER): Payer: Medicare HMO | Admitting: Internal Medicine

## 2019-05-21 VITALS — BP 168/86 | HR 88 | Temp 98.6°F | Ht 70.0 in | Wt 234.0 lb

## 2019-05-21 DIAGNOSIS — M546 Pain in thoracic spine: Secondary | ICD-10-CM | POA: Diagnosis not present

## 2019-05-21 DIAGNOSIS — N183 Chronic kidney disease, stage 3 unspecified: Secondary | ICD-10-CM | POA: Diagnosis not present

## 2019-05-21 DIAGNOSIS — E782 Mixed hyperlipidemia: Secondary | ICD-10-CM | POA: Diagnosis not present

## 2019-05-21 DIAGNOSIS — R6889 Other general symptoms and signs: Secondary | ICD-10-CM | POA: Diagnosis not present

## 2019-05-21 DIAGNOSIS — E559 Vitamin D deficiency, unspecified: Secondary | ICD-10-CM

## 2019-05-21 DIAGNOSIS — E1142 Type 2 diabetes mellitus with diabetic polyneuropathy: Secondary | ICD-10-CM | POA: Diagnosis not present

## 2019-05-21 DIAGNOSIS — M79641 Pain in right hand: Secondary | ICD-10-CM

## 2019-05-21 DIAGNOSIS — I10 Essential (primary) hypertension: Secondary | ICD-10-CM

## 2019-05-21 DIAGNOSIS — M79642 Pain in left hand: Secondary | ICD-10-CM | POA: Diagnosis not present

## 2019-05-21 DIAGNOSIS — G4700 Insomnia, unspecified: Secondary | ICD-10-CM

## 2019-05-21 DIAGNOSIS — M549 Dorsalgia, unspecified: Secondary | ICD-10-CM | POA: Insufficient documentation

## 2019-05-21 MED ORDER — MELOXICAM 15 MG PO TABS: 15.0000 mg | ORAL_TABLET | Freq: Every day | ORAL | 3 refills | Status: DC | PRN

## 2019-05-21 MED ORDER — CARVEDILOL 25 MG PO TABS: 25.0000 mg | ORAL_TABLET | Freq: Two times a day (BID) | ORAL | 3 refills | Status: AC

## 2019-05-21 NOTE — Assessment & Plan Note (Signed)
stable overall by history and exam, recent data reviewed with pt, and pt to continue medical treatment as before,  to f/u any worsening symptoms or concerns  

## 2019-05-21 NOTE — Assessment & Plan Note (Signed)
Uncontrolled with good compliacne, for increased coreg to 25 bid, cont all other tx,  to f/u any worsening symptoms or concerns and BP at home and next visit

## 2019-05-21 NOTE — Progress Notes (Signed)
Subjective:    Patient ID: John Dalton, male    DOB: 03-Dec-1960, 59 y.o.   MRN: [id number]  HPI Here after seen in ED with bilateral hand pain, severe, couldn't sleep, without trauma, fever or swelling, voltaren gel only helps some for a few hours, pain overall is none in the AM, but worse later in the day, retired and not really using hands for work on regular basis. No hx of gout.  Received hydrocodon 5 325 in ED which helped immensely, then home with steroid dosepak and flexeril which also helped back pain muscle spasm parathoracic now resolved.  States good compliance with all meds.  CBGs mostly in the 180s and improved from 200 to 300s, does not want change for now.  Sleeping better with less hand pain. Taking Vit D as rx.   Past Medical History:  Diagnosis Date  . Allergy   . Diabetes mellitus ([place])   . Diverticulitis   . Environmental and seasonal allergies   . 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 HTN (hypertension)   . Hypercholesteremia   . Neuropathy 01/11/2017   Past Surgical History:  Procedure Laterality Date  . Colon resection  2006   due to diverticulitis  . LEFT HEART CATH AND CORONARY ANGIOGRAPHY N/A 04/23/2018   Procedure: LEFT HEART CATH AND CORONARY ANGIOGRAPHY;  Surgeon: Troy Sine, MD;  Location: [facility];  Service: Cardiovascular;  Laterality: N/A;    reports that he has been smoking cigarettes. He has been smoking about 0.00 packs per day for the past 20.00 years. He has never used smokeless tobacco. He reports that he does not drink alcohol or use drugs. family history includes Diabetes in his father, paternal grandfather, and paternal grandmother; Hypertension in his maternal grandfather, maternal grandmother, mother, paternal grandfather, paternal grandmother, and sister. Allergies  Allergen Reactions  . Gabapentin Swelling  . Metformin And Related Diarrhea   Current Outpatient Medications on File Prior  to Visit  Medication Sig Dispense Refill  . Accu-Chek Softclix Lancets lancets USE TO TEST BLOOD SUGAR UP TO TWICE DAILY AS DIRECTED 100 each 3  . acetaminophen (TYLENOL) 325 MG tablet Take 2 tablets (650 mg total) by mouth every 4 (four) hours as needed for headache or mild pain. 30 tablet 0  . amLODipine (NORVASC) 10 MG tablet Take 1 tablet (10 mg total) by mouth at bedtime. 90 tablet 3  . aspirin EC 81 MG tablet Take 81 mg by mouth daily.    . blood glucose meter kit and supplies Dispense based on patient and insurance preference. Use up to two times daily as directed. (FOR ICD-10 E10.9, E11.9). 1 each 0  . gabapentin (NEURONTIN) 600 MG tablet Take 2 tablets (1,200 mg total) by mouth 3 (three) times daily. 540 tablet 1  . glucose blood (ACCU-CHEK AVIVA PLUS) test strip USE TO TEST BLOOD SUGAR UP TO TWICE DAILY AS DIRECTED 100 strip 3  . glucose blood (ACCU-CHEK AVIVA) test strip Use to check blood sugar 2-3 times a day. E11.9 100 each 12  . insulin aspart (NOVOLOG) 100 UNIT/ML injection Inject 8 Units into the skin 3 (three) times daily with meals. 10 mL 11  . insulin NPH-regular Human (NOVOLIN 70/30 RELION) (70-30) 100 UNIT/ML injection E11.9 INJECT 90 UNITS SUBCUTANEOUSLY TWICE DAILY WITH A MEAL 40 mL 11  . Insulin Syringe-Needle U-100 (BD INSULIN SYRINGE ULTRAFINE) 31G X 15/64" 0.5 ML MISC Use 1 needle  per injection to inject insulin 1-4 times daily as instructed. 100 each 11  . lisinopril (ZESTRIL) 40 MG tablet Take 1 tablet (40 mg total) by mouth daily. 90 tablet 3  . Multiple Vitamins-Minerals (CENTRUM ADULTS PO) Take 1 tablet daily by mouth.    . rosuvastatin (CRESTOR) 40 MG tablet Take 1 tablet (40 mg total) by mouth daily. 90 tablet 3  . Vitamin D, Ergocalciferol, (DRISDOL) 1.25 MG (50000 UNIT) CAPS capsule Take 1 capsule (50,000 Units total) by mouth every 7 (seven) days. 12 capsule 0  . zolpidem (AMBIEN) 10 MG tablet Take 1 tablet (10 mg total) by mouth at bedtime as needed for sleep.  90 tablet 1   No current facility-administered medications on file prior to visit.   Review of Systems All otherwise neg per pt     Objective:   Physical Exam BP (!) 168/86 (BP Location: Left Arm, Patient Position: Sitting, Cuff Size: Large)   Pulse 88   Temp 98.6 F (37 C) (Oral)   Ht '5\' 10"'$  (1.778 m)   Wt 234 lb (106.1 kg)   SpO2 96%   BMI 33.58 kg/m  VS noted,  Constitutional: Pt appears in NAD HENT: Head: NCAT.  Right Ear: External ear normal.  Left Ear: External ear normal.  Eyes: . Pupils are equal, round, and reactive to light. Conjunctivae and EOM are normal Nose: without d/c or deformity Neck: Neck supple. Gross normal ROM Cardiovascular: Normal rate and regular rhythm.   Pulmonary/Chest: Effort normal and breath sounds without rales or wheezing.  Abd:  Soft, NT, ND, + BS, no organomegaly Neurological: Pt is alert. At baseline orientation, motor grossly intact Skin: Skin is warm. No rashes, other new lesions, no LE edema Psychiatric: Pt behavior is normal without agitation  All otherwise neg per pt except mild tenderness to bilateral hands with no obvious deformity or swelling, increased tone to paraspinal muscles in the thoracic spine but no midline spinal pain  Lab Results  Component Value Date   WBC 6.8 03/23/2019   HGB 13.2 03/23/2019   HCT 39.2 03/23/2019   PLT 228.0 03/23/2019   GLUCOSE 66 (L) 03/23/2019   CHOL 151 03/23/2019   TRIG 107.0 03/23/2019   HDL 36.90 (L) 03/23/2019   LDLDIRECT 71.0 09/22/2018   LDLCALC 93 03/23/2019   ALT 20 03/23/2019   AST 15 03/23/2019   NA 145 03/23/2019   K 3.8 03/23/2019   CL 108 03/23/2019   CREATININE 1.11 03/23/2019   BUN 18 03/23/2019   CO2 26 03/23/2019   TSH 1.54 03/23/2019   PSA 1.42 03/23/2019   HGBA1C 9.9 (H) 03/23/2019   MICROALBUR 5.1 (H) 03/23/2019        Assessment & Plan:

## 2019-05-21 NOTE — Assessment & Plan Note (Signed)
Improved, declines f/u a1c today,  to f/u any worsening symptoms or concerns

## 2019-05-21 NOTE — Assessment & Plan Note (Signed)
Cont oral replacement 

## 2019-05-21 NOTE — Patient Instructions (Signed)
Ok to increase the carvedilol to 25 mg twice per day  Please take all new medication as prescribed - the mobic anti-inflammatory for hand pain  Ok to continue the voltaran gel topically as needed as well  Please continue all other medications as before, and refills have been done if requested.  Please have the pharmacy call with any other refills you may need.  Please continue your efforts at being more active, low cholesterol diet, and weight control.  Please keep your appointments with your specialists as you may have planned

## 2019-05-21 NOTE — Assessment & Plan Note (Signed)
Worse with pain, then better again, cont same tx

## 2019-05-21 NOTE — Assessment & Plan Note (Signed)
Resolved, cont to follow °

## 2019-05-21 NOTE — Assessment & Plan Note (Addendum)
Some slight puffiness of left MCPs noted today, doubt gout, most likely DJD, for mobic 15 qd prn, volt gel prn, reassured  I spent 40 minutes in preparing to see the patient by review of recent labs, imaging and procedures, obtaining and reviewing separately obtained history, communicating with the patient and family or caregiver, ordering medications, tests or procedures, and documenting clinical information in the EHR including the differential Dx, treatment, and any further evaluation and other management of bilat hand pain, back pain, DM, HTN, HLD, CKD, insomnia, vit d deficiency,

## 2019-06-09 ENCOUNTER — Other Ambulatory Visit: Payer: Self-pay | Admitting: Internal Medicine

## 2019-06-26 ENCOUNTER — Encounter (HOSPITAL_COMMUNITY): Payer: Self-pay

## 2019-06-26 ENCOUNTER — Emergency Department (HOSPITAL_COMMUNITY)
Admission: EM | Admit: 2019-06-26 | Discharge: 2019-06-26 | Disposition: A | Discharge status: Home / Self Care | Payer: Medicare HMO | Attending: Emergency Medicine | Admitting: Emergency Medicine

## 2019-06-26 ENCOUNTER — Emergency Department (HOSPITAL_COMMUNITY): Payer: Medicare HMO

## 2019-06-26 ENCOUNTER — Other Ambulatory Visit: Payer: Self-pay

## 2019-06-26 DIAGNOSIS — E1122 Type 2 diabetes mellitus with diabetic chronic kidney disease: Secondary | ICD-10-CM | POA: Insufficient documentation

## 2019-06-26 DIAGNOSIS — R1110 Vomiting, unspecified: Secondary | ICD-10-CM | POA: Diagnosis not present

## 2019-06-26 DIAGNOSIS — F17210 Nicotine dependence, cigarettes, uncomplicated: Secondary | ICD-10-CM | POA: Diagnosis not present

## 2019-06-26 DIAGNOSIS — R109 Unspecified abdominal pain: Secondary | ICD-10-CM | POA: Diagnosis not present

## 2019-06-26 DIAGNOSIS — K389 Disease of appendix, unspecified: Secondary | ICD-10-CM | POA: Diagnosis not present

## 2019-06-26 DIAGNOSIS — I129 Hypertensive chronic kidney disease with stage 1 through stage 4 chronic kidney disease, or unspecified chronic kidney disease: Secondary | ICD-10-CM | POA: Diagnosis not present

## 2019-06-26 DIAGNOSIS — N183 Chronic kidney disease, stage 3 unspecified: Secondary | ICD-10-CM | POA: Insufficient documentation

## 2019-06-26 DIAGNOSIS — Z794 Long term (current) use of insulin: Secondary | ICD-10-CM | POA: Diagnosis not present

## 2019-06-26 DIAGNOSIS — R197 Diarrhea, unspecified: Secondary | ICD-10-CM | POA: Diagnosis not present

## 2019-06-26 DIAGNOSIS — R110 Nausea: Secondary | ICD-10-CM | POA: Diagnosis not present

## 2019-06-26 DIAGNOSIS — I2510 Atherosclerotic heart disease of native coronary artery without angina pectoris: Secondary | ICD-10-CM | POA: Diagnosis not present

## 2019-06-26 DIAGNOSIS — E1140 Type 2 diabetes mellitus with diabetic neuropathy, unspecified: Secondary | ICD-10-CM | POA: Insufficient documentation

## 2019-06-26 DIAGNOSIS — R1084 Generalized abdominal pain: Secondary | ICD-10-CM | POA: Diagnosis not present

## 2019-06-26 DIAGNOSIS — Z7982 Long term (current) use of aspirin: Secondary | ICD-10-CM | POA: Diagnosis not present

## 2019-06-26 DIAGNOSIS — Z79899 Other long term (current) drug therapy: Secondary | ICD-10-CM | POA: Insufficient documentation

## 2019-06-26 DIAGNOSIS — K388 Other specified diseases of appendix: Secondary | ICD-10-CM | POA: Diagnosis not present

## 2019-06-26 DIAGNOSIS — R52 Pain, unspecified: Secondary | ICD-10-CM | POA: Diagnosis not present

## 2019-06-26 DIAGNOSIS — I10 Essential (primary) hypertension: Secondary | ICD-10-CM

## 2019-06-26 DIAGNOSIS — R1012 Left upper quadrant pain: Secondary | ICD-10-CM | POA: Diagnosis not present

## 2019-06-26 LAB — URINALYSIS, ROUTINE W REFLEX MICROSCOPIC
Bilirubin Urine: NEGATIVE
Glucose, UA: NEGATIVE mg/dL
Hgb urine dipstick: NEGATIVE
Ketones, ur: NEGATIVE mg/dL
Leukocytes,Ua: NEGATIVE
Nitrite: NEGATIVE
Protein, ur: NEGATIVE mg/dL
Specific Gravity, Urine: 1.043 — ABNORMAL HIGH (ref 1.005–1.030)
pH: 5 (ref 5.0–8.0)

## 2019-06-26 LAB — COMPREHENSIVE METABOLIC PANEL
ALT: 23 U/L (ref 0–44)
AST: 18 U/L (ref 15–41)
Albumin: 3.9 g/dL (ref 3.5–5.0)
Alkaline Phosphatase: 103 U/L (ref 38–126)
Anion gap: 8 (ref 5–15)
BUN: 17 mg/dL (ref 6–20)
CO2: 22 mmol/L (ref 22–32)
Calcium: 9.1 mg/dL (ref 8.9–10.3)
Chloride: 107 mmol/L (ref 98–111)
Creatinine, Ser: 1.21 mg/dL (ref 0.61–1.24)
GFR calc Af Amer: >60 mL/min (ref >60)
GFR calc non Af Amer: >60 mL/min (ref >60)
Glucose, Bld: 99 mg/dL (ref 70–99)
Potassium: 3.7 mmol/L (ref 3.5–5.1)
Sodium: 137 mmol/L (ref 135–145)
Total Bilirubin: 0.7 mg/dL (ref 0.3–1.2)
Total Protein: 6.9 g/dL (ref 6.5–8.1)

## 2019-06-26 LAB — CBC WITH DIFFERENTIAL/PLATELET
Abs Immature Granulocytes: 0.02 10*3/uL (ref 0.00–0.07)
Basophils Absolute: 0 10*3/uL (ref 0.0–0.1)
Basophils Relative: 0 %
Eosinophils Absolute: 0.1 10*3/uL (ref 0.0–0.5)
Eosinophils Relative: 1 %
HCT: 39.2 % (ref 39.0–52.0)
Hemoglobin: 13.1 g/dL (ref 13.0–17.0)
Immature Granulocytes: 0 %
Lymphocytes Relative: 23 %
Lymphs Abs: 1.5 10*3/uL (ref 0.7–4.0)
MCH: 27.5 pg (ref 26.0–34.0)
MCHC: 33.4 g/dL (ref 30.0–36.0)
MCV: 82.4 fL (ref 80.0–100.0)
Monocytes Absolute: 0.8 10*3/uL (ref 0.1–1.0)
Monocytes Relative: 13 %
Neutro Abs: 4 10*3/uL (ref 1.7–7.7)
Neutrophils Relative %: 63 %
Platelets: 227 10*3/uL (ref 150–400)
RBC: 4.76 MIL/uL (ref 4.22–5.81)
RDW: 14.8 % (ref 11.5–15.5)
WBC: 6.4 10*3/uL (ref 4.0–10.5)
nRBC: 0 % (ref 0.0–0.2)

## 2019-06-26 LAB — LIPASE, BLOOD: Lipase: 27 U/L (ref 11–51)

## 2019-06-26 MED ORDER — ONDANSETRON HCL 4 MG/2ML IJ SOLN
4.0000 mg | Freq: Once | INTRAMUSCULAR | Status: AC
  Administered 2019-06-26: 4 mg via INTRAVENOUS
  Filled 2019-06-26: qty 2

## 2019-06-26 MED ORDER — MORPHINE SULFATE (PF) 4 MG/ML IV SOLN
4.0000 mg | Freq: Once | INTRAVENOUS | Status: AC
  Administered 2019-06-26: 4 mg via INTRAVENOUS
  Filled 2019-06-26: qty 1

## 2019-06-26 MED ORDER — SODIUM CHLORIDE 0.9 % IV BOLUS
1000.0000 mL | Freq: Once | INTRAVENOUS | Status: AC
  Administered 2019-06-26: 1000 mL via INTRAVENOUS

## 2019-06-26 MED ORDER — SODIUM CHLORIDE (PF) 0.9 % IJ SOLN
INTRAMUSCULAR | Status: AC
  Filled 2019-06-26: qty 50

## 2019-06-26 MED ORDER — IOHEXOL 300 MG/ML  SOLN
100.0000 mL | Freq: Once | INTRAMUSCULAR | Status: AC | PRN
  Administered 2019-06-26: 100 mL via INTRAVENOUS

## 2019-06-26 MED ORDER — LOPERAMIDE HCL 2 MG PO CAPS
4.0000 mg | ORAL_CAPSULE | Freq: Once | ORAL | Status: AC
  Administered 2019-06-26: 4 mg via ORAL
  Filled 2019-06-26: qty 2

## 2019-06-26 NOTE — ED Notes (Signed)
Patient aware urine sample is needed. States he does not need to urinate at this moment and to give him some time. Will continue to monitor.

## 2019-06-26 NOTE — Discharge Instructions (Addendum)
Take loperamide as needed for diarrhea.  Return if pain is getting worse, or if you start vomiting

## 2019-06-26 NOTE — ED Provider Notes (Signed)
[facility]-EMERGENCY DEPT Provider Note   CSN: [id number] Arrival date & time: 06/26/19  0036   History Chief Complaint  Patient presents with  . Abdominal Pain    John Dalton is a 58 y.o. male.  The history is provided by the patient.  Abdominal Pain He has history of hypertension, diabetes, hyperlipidemia, coronary artery disease, chronic kidney disease, diverticulitis and comes in because of diarrhea and abdominal pain.  He has been having diarrhea for the last 3 days.  He has had numerous watery bowel movements without blood or mucus.  There has been some mild nausea but no vomiting.  Abdominal pain started this evening and is located in the left upper quadrant without radiation.  Nothing makes pain better, nothing makes it worse.  He rates his pain at 7/10.  This is similar to how he presented with diverticulitis in the past.  Past Medical History:  Diagnosis Date  . Allergy   . Diabetes mellitus (HCC)   . Diverticulitis   . Environmental and seasonal allergies   . 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 HTN (hypertension)   . Hypercholesteremia   . Neuropathy 01/11/2017    Patient Active Problem List   Diagnosis Date Noted  . Back pain 05/21/2019  . Vitamin D deficiency 03/28/2019  . Insomnia 03/26/2019  . Bilateral hand pain 03/26/2019  . Chest pain 04/22/2018  . DKA (diabetic ketoacidoses) (HCC) 04/21/2018  . CAD (coronary artery disease) 04/21/2018  . Type 2 diabetes mellitus with hyperosmolar nonketotic hyperglycemia (HCC) 04/21/2018  . Intermittent left-sided chest pain 04/21/2018  . CKD (chronic kidney disease) stage 3, GFR 30-59 ml/min 03/26/2018  . Alopecia 06/13/2017  . Neuropathy 01/11/2017  . Erectile dysfunction 12/29/2015  . Abscess of back 12/11/2015  . Heart palpitations 09/25/2015  . Stye external 09/25/2015  . Type 2 diabetes mellitus (HCC) 08/16/2015  . Encounter for well adult exam  with abnormal findings 08/16/2015  . Medicare annual wellness visit, subsequent 08/16/2015  . Hypertension, essential 08/16/2015  . Hyperlipidemia 08/16/2015  . Obesity 08/16/2015  . Impacted cerumen of both ears 08/16/2015    Past Surgical History:  Procedure Laterality Date  . Colon resection  2006   due to diverticulitis  . LEFT HEART CATH AND CORONARY ANGIOGRAPHY N/A 04/23/2018   Procedure: LEFT HEART CATH AND CORONARY ANGIOGRAPHY;  Surgeon: Kelly, Thomas A, MD;  Location: [facility] INVASIVE CV LAB;  Service: Cardiovascular;  Laterality: N/A;       Family History  Problem Relation Age of Onset  . Diabetes Father   . Hypertension Mother   . Hypertension Sister   . Diabetes Paternal Grandmother   . Hypertension Paternal Grandmother   . Diabetes Paternal Grandfather   . Hypertension Paternal Grandfather   . Hypertension Maternal Grandmother   . Hypertension Maternal Grandfather     Social History   Tobacco Use  . Smoking status: Current Some Day Smoker    Packs/day: 0.00    Years: 20.00    Pack years: 0.00    Types: Cigarettes  . Smokeless tobacco: Never Used  . Tobacco comment: denies 04/21/2018  Substance Use Topics  . Alcohol use: No    Alcohol/week: 0.0 standard drinks  . Drug use: No    Home Medications Prior to Admission medications   Medication Sig Start Date End Date Taking? Authorizing Provider  aspirin EC 81 MG tablet Take 81 mg by mouth daily.     Yes [provider]  carvedilol (COREG) 25 MG tablet Take 1 tablet (25 mg total) by mouth 2 (two) times daily with a meal. 05/21/19  Yes John, James W, MD  insulin NPH-regular Human (NOVOLIN 70/30 RELION) (70-30) 100 UNIT/ML injection E11.9 INJECT 90 UNITS SUBCUTANEOUSLY TWICE DAILY WITH A MEAL 03/26/19  Yes John, James W, MD  lisinopril (ZESTRIL) 40 MG tablet Take 1 tablet (40 mg total) by mouth daily. 03/26/19  Yes John, James W, MD  meloxicam (MOBIC) 15 MG tablet Take 1 tablet (15 mg total) by mouth daily as  needed for pain. 05/21/19  Yes John, James W, MD  Multiple Vitamins-Minerals (CENTRUM ADULTS PO) Take 1 tablet daily by mouth.   Yes [provider]  rosuvastatin (CRESTOR) 40 MG tablet Take 1 tablet (40 mg total) by mouth daily. 03/26/19  Yes John, James W, MD  ACCU-CHEK AVIVA PLUS test strip USE TO TEST BLOOD SUGAR UP TO TWICE DAILY AS DIRECTED 06/09/19   John, James W, MD  Accu-Chek Softclix Lancets lancets USE TO TEST BLOOD SUGAR UP TO TWICE DAILY AS DIRECTED 06/09/19   John, James W, MD  acetaminophen (TYLENOL) 325 MG tablet Take 2 tablets (650 mg total) by mouth every 4 (four) hours as needed for headache or mild pain. Patient not taking: Reported on 06/26/2019 04/24/18   Swayze, Ava, DO  amLODipine (NORVASC) 10 MG tablet Take 1 tablet (10 mg total) by mouth at bedtime. Patient not taking: Reported on 06/26/2019 03/26/19   John, James W, MD  blood glucose meter kit and supplies Dispense based on patient and insurance preference. Use up to two times daily as directed. (FOR ICD-10 E10.9, E11.9). 12/28/18   John, James W, MD  gabapentin (NEURONTIN) 600 MG tablet Take 2 tablets (1,200 mg total) by mouth 3 (three) times daily. Patient not taking: Reported on 06/26/2019 03/26/19   John, James W, MD  glucose blood (ACCU-CHEK AVIVA) test strip Use to check blood sugar 2-3 times a day. E11.9 08/24/18   John, James W, MD  insulin aspart (NOVOLOG) 100 UNIT/ML injection Inject 8 Units into the skin 3 (three) times daily with meals. Patient not taking: Reported on 06/26/2019 03/26/19   John, James W, MD  Insulin Syringe-Needle U-100 (BD INSULIN SYRINGE ULTRAFINE) 31G X 15/64" 0.5 ML MISC Use 1 needle per injection to inject insulin 1-4 times daily as instructed. 03/26/19   John, James W, MD  Vitamin D, Ergocalciferol, (DRISDOL) 1.25 MG (50000 UNIT) CAPS capsule Take 1 capsule (50,000 Units total) by mouth every 7 (seven) days. Patient not taking: Reported on 06/26/2019 03/26/19   John, James W, MD  zolpidem  (AMBIEN) 10 MG tablet Take 1 tablet (10 mg total) by mouth at bedtime as needed for sleep. Patient not taking: Reported on 06/26/2019 03/26/19   John, James W, MD    Allergies    Gabapentin and Metformin and related  Review of Systems   Review of Systems  Gastrointestinal: Positive for abdominal pain.  All other systems reviewed and are negative.   Physical Exam Updated Vital Signs BP (!) 142/86 (BP Location: Left Arm)   Pulse 86   Temp 98.5 F (36.9 C) (Oral)   Resp 15   SpO2 98%   Physical Exam Vitals and nursing note reviewed.   58 year old male, resting comfortably and in no acute distress. Vital signs are significant for borderline elevated blood pressure. Oxygen saturation is 98%, which is normal. Head is normocephalic and atraumatic. PERRLA, EOMI.   Oropharynx is clear. Neck is nontender and supple without adenopathy or JVD. Back is nontender and there is no CVA tenderness. Lungs are clear without rales, wheezes, or rhonchi. Chest is nontender. Heart has regular rate and rhythm without murmur. Abdomen is soft, flat, with mild left upper quadrant tenderness.  There is no rebound or guarding.  There are no masses or hepatosplenomegaly and peristalsis is hypoactive.  Multiple surgical scars are present. Extremities have no cyanosis or edema, full range of motion is present. Skin is warm and dry without rash. Neurologic: Mental status is normal, cranial nerves are intact, there are no motor or sensory deficits.  ED Results / Procedures / Treatments   Labs (all labs ordered are listed, but only abnormal results are displayed) Labs Reviewed  URINALYSIS, ROUTINE W REFLEX MICROSCOPIC - Abnormal; Notable for the following components:      Result Value   Specific Gravity, Urine 1.043 (*)    All other components within normal limits  COMPREHENSIVE METABOLIC PANEL  LIPASE, BLOOD  CBC WITH DIFFERENTIAL/PLATELET   Radiology CT ABDOMEN PELVIS W CONTRAST  Result Date:  06/26/2019 CLINICAL DATA:  Left-sided abdominal pain. Nausea, vomiting, diarrhea for 2 days. History of diverticulitis. Diverticulitis suspected. EXAM: CT ABDOMEN AND PELVIS WITH CONTRAST TECHNIQUE: Multidetector CT imaging of the abdomen and pelvis was performed using the standard protocol following bolus administration of intravenous contrast. CONTRAST:  100mL OMNIPAQUE IOHEXOL 300 MG/ML  SOLN COMPARISON:  None. FINDINGS: Lower chest: Hypoventilatory and subsegmental atelectasis at the bases. No pleural fluid. Upper normal heart size. Hepatobiliary: No focal liver abnormality is seen. No gallstones, gallbladder wall thickening, or biliary dilatation. Pancreas: No ductal dilatation or inflammation. Spleen: Normal in size without focal abnormality. Probable splenule anterior to the spleen. Adrenals/Urinary Tract: No adrenal nodule. No hydronephrosis. There is moderate but symmetric bilateral perinephric edema. Homogeneous renal enhancement with symmetric excretion on delayed phase imaging. 13 mm cyst in the upper left kidney. Urinary bladder is partially distended. Equivocal wall thickening. Stomach/Bowel: Ingested material within the stomach. No evidence of small bowel obstruction or inflammation. No terminal ileal inflammation. Appendix is prominent measuring 7-8 mm and contains intraluminal appendicoliths. No periappendiceal fat stranding or inflammatory change. There is liquid stool in the proximal colon. Partial colectomy with enteric sutures in the sigmoid colon. Minimal sigmoid diverticulosis without diverticulitis. There is no colonic wall thickening or inflammation. Vascular/Lymphatic: Mild aortic atherosclerosis. No aortic aneurysm. Portal vein is patent. Few prominent central small bowel lymph nodes with mesenteric edema. Reproductive: Prostate is unremarkable. Other: Fat in both inguinal canals. Postsurgical change of the anterior abdominal wall. Patchy subcutaneous density in the right lower anterior  abdominal wall may be related to medication injection sites. Sequela of prior descending ostomy in the left abdominal wall. No free air, free fluid, or intra-abdominal fluid collection. Musculoskeletal: There are no acute or suspicious osseous abnormalities. IMPRESSION: 1. Minimal sigmoid diverticulosis without diverticulitis. 2. Prominent central small bowel lymph nodes with mesenteric edema, can be seen with mesenteric panniculitis, of unknown acuity. 3. Bilateral perinephric edema, typically chronic, however there is mild bladder wall thickening. Recommend further evaluation with urinalysis. 4. Mildly prominent appendix containing intraluminal appendicoliths, however no evidence of appendicitis or periappendiceal fat stranding. Presence of appendicolith may place patient at risk for future appendicitis. Consider elective surgical referral. Aortic Atherosclerosis (ICD10-I70.0). Electronically Signed   By: Melanie  Sanford M.D.   On: 06/26/2019 03:31    Procedures Procedures   Medications Ordered in ED Medications  sodium chloride 0.9 % bolus   1,000 mL (has no administration in time range)  ondansetron (ZOFRAN) injection 4 mg (has no administration in time range)  morphine 4 MG/ML injection 4 mg (has no administration in time range)    ED Course  I have reviewed the triage vital signs and the nursing notes.  Pertinent labs & imaging results that were available during my care of the patient were reviewed by me and considered in my medical decision making (see chart for details).  MDM Rules/Calculators/A&P Diarrhea with abdominal pain worrisome for recurrent diverticulitis.  Consider urinary tract infection, viral gastroenteritis, bacterial gastroenteritis.  Old records are reviewed, and he has no relevant past visits.  Will check screening labs and send for CT of abdomen and pelvis.  Labs are reassuring.  CT scan shows no evidence of diverticulitis, concern for possible urinary tract infection.   However, urinalysis is normal.  Also, incidental finding of appendicolith.  Marland Kitchen is reevaluated and is resting comfortably and abdomen is only minimally tender.  He is felt to be safe for discharge.  He is advised to take loperamide as needed for diarrhea, return if pain gets worse, he started running a fever, or starts vomiting.  Final Clinical Impression(s) / ED Diagnoses Final diagnoses:  Diarrhea, unspecified type  LUQ abdominal pain  Elevated blood pressure reading with diagnosis of hypertension  Appendicolith    Rx / DC Orders ED Discharge Orders    None       Delora Fuel, MD 48/18/56 [phone]

## 2019-06-26 NOTE — ED Triage Notes (Addendum)
PER EMS: Patient is coming from home with c/o abdominal pain. Pain is currently in his left upper and lower quadrant, 8/10. Endorses N/VD for the past two days. Hx diverticulitis.  EMS VITALS: BP 150/pal HR 90 RR 18 SPO2 96% RA CBG 102

## 2019-07-06 ENCOUNTER — Telehealth: Payer: Self-pay | Admitting: Internal Medicine

## 2019-07-06 MED ORDER — MELOXICAM 15 MG PO TABS: 15.0000 mg | ORAL_TABLET | Freq: Every day | ORAL | 3 refills | Status: AC | PRN

## 2019-07-06 NOTE — Telephone Encounter (Signed)
Sent over to Dr. John. 

## 2019-07-06 NOTE — Telephone Encounter (Signed)
New message:   John Dalton is needing new prescriptions for these medications since he is new to them.   meloxicam (MOBIC) 15 MG tablet BD single use swaps

## 2019-07-06 NOTE — Telephone Encounter (Signed)
Ok for [employer] prn  Not sure how to rx the bd single use swaps

## 2019-09-09 ENCOUNTER — Emergency Department (HOSPITAL_COMMUNITY): Payer: Self-pay

## 2019-09-09 ENCOUNTER — Emergency Department (HOSPITAL_COMMUNITY)
Admission: EM | Admit: 2019-09-09 | Discharge: 2019-09-09 | Payer: Self-pay | Attending: Emergency Medicine | Admitting: Emergency Medicine

## 2019-09-09 DIAGNOSIS — J705 Respiratory conditions due to smoke inhalation: Secondary | ICD-10-CM | POA: Insufficient documentation

## 2019-09-09 DIAGNOSIS — T59811A Toxic effect of smoke, accidental (unintentional), initial encounter: Secondary | ICD-10-CM | POA: Insufficient documentation

## 2019-09-09 DIAGNOSIS — R4182 Altered mental status, unspecified: Secondary | ICD-10-CM | POA: Insufficient documentation

## 2019-09-09 DIAGNOSIS — Z20822 Contact with and (suspected) exposure to covid-19: Secondary | ICD-10-CM | POA: Insufficient documentation

## 2019-09-09 DIAGNOSIS — T1490XA Injury, unspecified, initial encounter: Secondary | ICD-10-CM

## 2019-09-09 DIAGNOSIS — Z23 Encounter for immunization: Secondary | ICD-10-CM | POA: Insufficient documentation

## 2019-09-09 DIAGNOSIS — R918 Other nonspecific abnormal finding of lung field: Secondary | ICD-10-CM | POA: Diagnosis not present

## 2019-09-09 DIAGNOSIS — R4020 Unspecified coma: Secondary | ICD-10-CM | POA: Diagnosis not present

## 2019-09-09 DIAGNOSIS — S199XXA Unspecified injury of neck, initial encounter: Secondary | ICD-10-CM | POA: Diagnosis not present

## 2019-09-09 DIAGNOSIS — I10 Essential (primary) hypertension: Secondary | ICD-10-CM | POA: Diagnosis not present

## 2019-09-09 DIAGNOSIS — R404 Transient alteration of awareness: Secondary | ICD-10-CM | POA: Diagnosis not present

## 2019-09-09 DIAGNOSIS — T59814A Toxic effect of smoke, undetermined, initial encounter: Secondary | ICD-10-CM | POA: Diagnosis not present

## 2019-09-09 DIAGNOSIS — Z4682 Encounter for fitting and adjustment of non-vascular catheter: Secondary | ICD-10-CM | POA: Diagnosis not present

## 2019-09-09 LAB — COMPREHENSIVE METABOLIC PANEL
ALT: 23 U/L (ref 0–44)
AST: 25 U/L (ref 15–41)
Albumin: 4.2 g/dL (ref 3.5–5.0)
Alkaline Phosphatase: 95 U/L (ref 38–126)
Anion gap: 15 (ref 5–15)
BUN: 12 mg/dL (ref 8–23)
CO2: 19 mmol/L — ABNORMAL LOW (ref 22–32)
Calcium: 9.5 mg/dL (ref 8.9–10.3)
Chloride: 100 mmol/L (ref 98–111)
Creatinine, Ser: 1.56 mg/dL — ABNORMAL HIGH (ref 0.61–1.24)
GFR calc Af Amer: 31 mL/min — ABNORMAL LOW (ref >60)
GFR calc non Af Amer: 26 mL/min — ABNORMAL LOW (ref >60)
Glucose, Bld: 328 mg/dL — ABNORMAL HIGH (ref 70–99)
Potassium: 3.7 mmol/L (ref 3.5–5.1)
Sodium: 134 mmol/L — ABNORMAL LOW (ref 135–145)
Total Bilirubin: 0.9 mg/dL (ref 0.3–1.2)
Total Protein: 7.5 g/dL (ref 6.5–8.1)

## 2019-09-09 LAB — I-STAT CHEM 8, ED
BUN: 14 mg/dL (ref 8–23)
Calcium, Ion: 1.14 mmol/L — ABNORMAL LOW (ref 1.15–1.40)
Chloride: 101 mmol/L (ref 98–111)
Creatinine, Ser: 1.2 mg/dL (ref 0.61–1.24)
Glucose, Bld: 323 mg/dL — ABNORMAL HIGH (ref 70–99)
HCT: 45 % (ref 39.0–52.0)
Hemoglobin: 15.3 g/dL (ref 13.0–17.0)
Potassium: 3.7 mmol/L (ref 3.5–5.1)
Sodium: 137 mmol/L (ref 135–145)
TCO2: 21 mmol/L — ABNORMAL LOW (ref 22–32)

## 2019-09-09 LAB — TRAUMA TEG PANEL
CFF Max Amplitude: 25.3 mm (ref 15–32)
Citrated Kaolin (R): 5.4 min (ref 4.6–9.1)
Citrated Rapid TEG (MA): 66.7 mm (ref 52–70)
Lysis at 30 Minutes: 0.2 % (ref 0.0–2.6)

## 2019-09-09 LAB — TYPE AND SCREEN
ABO/RH(D): A POS
Antibody Screen: NEGATIVE

## 2019-09-09 LAB — CBC
HCT: 43.7 % (ref 39.0–52.0)
Hemoglobin: 15 g/dL (ref 13.0–17.0)
MCH: 28.1 pg (ref 26.0–34.0)
MCHC: 34.3 g/dL (ref 30.0–36.0)
MCV: 82 fL (ref 80.0–100.0)
Platelets: 260 10*3/uL (ref 150–400)
RBC: 5.33 MIL/uL (ref 4.22–5.81)
RDW: 14 % (ref 11.5–15.5)
WBC: 5.2 10*3/uL (ref 4.0–10.5)
nRBC: 0 % (ref 0.0–0.2)

## 2019-09-09 LAB — I-STAT ARTERIAL BLOOD GAS, ED
Acid-Base Excess: 0 mmol/L (ref 0.0–2.0)
Bicarbonate: 22.9 mmol/L (ref 20.0–28.0)
Calcium, Ion: 1.18 mmol/L (ref 1.15–1.40)
HCT: 36 % — ABNORMAL LOW (ref 39.0–52.0)
Hemoglobin: 12.2 g/dL — ABNORMAL LOW (ref 13.0–17.0)
O2 Saturation: 100 %
Patient temperature: 97
Potassium: 3.4 mmol/L — ABNORMAL LOW (ref 3.5–5.1)
Sodium: 137 mmol/L (ref 135–145)
TCO2: 24 mmol/L (ref 22–32)
pCO2 arterial: 31.2 mmHg — ABNORMAL LOW (ref 32.0–48.0)
pH, Arterial: 7.469 — ABNORMAL HIGH (ref 7.350–7.450)
pO2, Arterial: 208 mmHg — ABNORMAL HIGH (ref 83.0–108.0)

## 2019-09-09 LAB — COOXEMETRY PANEL
Carboxyhemoglobin: 15.4 % (ref 0.5–1.5)
Methemoglobin: 1.7 % — ABNORMAL HIGH (ref 0.0–1.5)
O2 Saturation: 96.6 %
Total hemoglobin: 12.7 g/dL (ref 12.0–16.0)

## 2019-09-09 LAB — ABO/RH: ABO/RH(D): A POS

## 2019-09-09 LAB — SARS CORONAVIRUS 2 BY RT PCR (HOSPITAL ORDER, PERFORMED IN ~~LOC~~ HOSPITAL LAB): SARS Coronavirus 2: NEGATIVE

## 2019-09-09 LAB — LACTIC ACID, PLASMA: Lactic Acid, Venous: 5.7 mmol/L (ref 0.5–1.9)

## 2019-09-09 LAB — PROTIME-INR
INR: 1.1 (ref 0.8–1.2)
Prothrombin Time: 14.1 seconds (ref 11.4–15.2)

## 2019-09-09 LAB — ETHANOL: Alcohol, Ethyl (B): <10 mg/dL (ref <10)

## 2019-09-09 MED ORDER — HYDROXOCOBALAMIN 5 G IV SOLR
5.0000 g | Freq: Once | INTRAVENOUS | Status: AC
  Administered 2019-09-09: 5 g via INTRAVENOUS
  Filled 2019-09-09: qty 5

## 2019-09-09 MED ORDER — PROPOFOL 1000 MG/100ML IV EMUL
5.0000 ug/kg/min | INTRAVENOUS | Status: DC
  Administered 2019-09-09: 20 ug/kg/min via INTRAVENOUS

## 2019-09-09 MED ORDER — TETANUS-DIPHTH-ACELL PERTUSSIS 5-2.5-18.5 LF-MCG/0.5 IM SUSP
0.5000 mL | Freq: Once | INTRAMUSCULAR | Status: AC
  Administered 2019-09-09: 0.5 mL via INTRAMUSCULAR
  Filled 2019-09-09: qty 0.5
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
## 2019-09-09 NOTE — Consult Note (Signed)
Responded to page, pt unavailable, no family present, please page again if further need of chaplain services.  Rev. Donnel Saxon Chaplain
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
## 2019-09-09 NOTE — ED Notes (Signed)
[facility] called by dee to transport to [facility]
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
## 2019-09-10 DIAGNOSIS — Z20822 Contact with and (suspected) exposure to covid-19: Secondary | ICD-10-CM | POA: Diagnosis not present

## 2019-09-10 DIAGNOSIS — I63449 Cerebral infarction due to embolism of unspecified cerebellar artery: Secondary | ICD-10-CM | POA: Diagnosis not present

## 2019-09-10 DIAGNOSIS — I50810 Right heart failure, unspecified: Secondary | ICD-10-CM | POA: Diagnosis not present

## 2019-09-10 DIAGNOSIS — J849 Interstitial pulmonary disease, unspecified: Secondary | ICD-10-CM | POA: Diagnosis not present

## 2019-09-10 DIAGNOSIS — Z4659 Encounter for fitting and adjustment of other gastrointestinal appliance and device: Secondary | ICD-10-CM | POA: Diagnosis not present

## 2019-09-10 DIAGNOSIS — G8911 Acute pain due to trauma: Secondary | ICD-10-CM | POA: Diagnosis not present

## 2019-09-10 DIAGNOSIS — E1165 Type 2 diabetes mellitus with hyperglycemia: Secondary | ICD-10-CM | POA: Diagnosis not present

## 2019-09-10 DIAGNOSIS — G931 Anoxic brain damage, not elsewhere classified: Secondary | ICD-10-CM | POA: Diagnosis not present

## 2019-09-10 DIAGNOSIS — J810 Acute pulmonary edema: Secondary | ICD-10-CM | POA: Diagnosis not present

## 2019-09-10 DIAGNOSIS — J683 Other acute and subacute respiratory conditions due to chemicals, gases, fumes and vapors: Secondary | ICD-10-CM | POA: Diagnosis not present

## 2019-09-10 DIAGNOSIS — Z043 Encounter for examination and observation following other accident: Secondary | ICD-10-CM | POA: Diagnosis not present

## 2019-09-10 DIAGNOSIS — T59811A Toxic effect of smoke, accidental (unintentional), initial encounter: Secondary | ICD-10-CM | POA: Diagnosis not present

## 2019-09-10 DIAGNOSIS — N179 Acute kidney failure, unspecified: Secondary | ICD-10-CM | POA: Diagnosis not present

## 2019-09-10 DIAGNOSIS — I639 Cerebral infarction, unspecified: Secondary | ICD-10-CM | POA: Diagnosis not present

## 2019-09-10 DIAGNOSIS — I6389 Other cerebral infarction: Secondary | ICD-10-CM | POA: Diagnosis not present

## 2019-09-10 DIAGNOSIS — I214 Non-ST elevation (NSTEMI) myocardial infarction: Secondary | ICD-10-CM | POA: Diagnosis not present

## 2019-09-10 DIAGNOSIS — I493 Ventricular premature depolarization: Secondary | ICD-10-CM | POA: Diagnosis not present

## 2019-09-10 DIAGNOSIS — G9340 Encephalopathy, unspecified: Secondary | ICD-10-CM | POA: Diagnosis not present

## 2019-09-10 DIAGNOSIS — I517 Cardiomegaly: Secondary | ICD-10-CM | POA: Diagnosis not present

## 2019-09-10 DIAGNOSIS — I5189 Other ill-defined heart diseases: Secondary | ICD-10-CM | POA: Diagnosis not present

## 2019-09-10 DIAGNOSIS — R6510 Systemic inflammatory response syndrome (SIRS) of non-infectious origin without acute organ dysfunction: Secondary | ICD-10-CM | POA: Diagnosis not present

## 2019-09-10 DIAGNOSIS — J705 Respiratory conditions due to smoke inhalation: Secondary | ICD-10-CM | POA: Diagnosis not present

## 2019-09-10 DIAGNOSIS — I2721 Secondary pulmonary arterial hypertension: Secondary | ICD-10-CM | POA: Diagnosis not present

## 2019-09-10 DIAGNOSIS — T300 Burn of unspecified body region, unspecified degree: Secondary | ICD-10-CM | POA: Diagnosis not present

## 2019-09-10 DIAGNOSIS — R918 Other nonspecific abnormal finding of lung field: Secondary | ICD-10-CM | POA: Diagnosis not present

## 2019-09-10 DIAGNOSIS — R579 Shock, unspecified: Secondary | ICD-10-CM | POA: Diagnosis not present

## 2019-09-10 DIAGNOSIS — I469 Cardiac arrest, cause unspecified: Secondary | ICD-10-CM | POA: Diagnosis not present

## 2019-09-10 DIAGNOSIS — X088XXA Exposure to other specified smoke, fire and flames, initial encounter: Secondary | ICD-10-CM | POA: Diagnosis not present

## 2019-09-10 DIAGNOSIS — R6521 Severe sepsis with septic shock: Secondary | ICD-10-CM | POA: Diagnosis not present

## 2019-09-10 DIAGNOSIS — Z9911 Dependence on respirator [ventilator] status: Secondary | ICD-10-CM | POA: Diagnosis not present

## 2019-09-10 DIAGNOSIS — E870 Hyperosmolality and hypernatremia: Secondary | ICD-10-CM | POA: Diagnosis not present

## 2019-09-10 DIAGNOSIS — I6340 Cerebral infarction due to embolism of unspecified cerebral artery: Secondary | ICD-10-CM | POA: Diagnosis not present

## 2019-09-10 DIAGNOSIS — R7989 Other specified abnormal findings of blood chemistry: Secondary | ICD-10-CM | POA: Diagnosis not present

## 2019-09-10 DIAGNOSIS — Z8674 Personal history of sudden cardiac arrest: Secondary | ICD-10-CM | POA: Diagnosis not present

## 2019-09-10 DIAGNOSIS — J9601 Acute respiratory failure with hypoxia: Secondary | ICD-10-CM | POA: Diagnosis not present

## 2019-09-10 DIAGNOSIS — R4182 Altered mental status, unspecified: Secondary | ICD-10-CM | POA: Diagnosis not present

## 2019-09-10 DIAGNOSIS — R509 Fever, unspecified: Secondary | ICD-10-CM | POA: Diagnosis not present

## 2019-09-10 DIAGNOSIS — R0602 Shortness of breath: Secondary | ICD-10-CM | POA: Diagnosis not present

## 2019-09-10 DIAGNOSIS — I444 Left anterior fascicular block: Secondary | ICD-10-CM | POA: Diagnosis not present

## 2019-09-10 DIAGNOSIS — J150 Pneumonia due to Klebsiella pneumoniae: Secondary | ICD-10-CM | POA: Diagnosis not present

## 2019-09-10 DIAGNOSIS — I4430 Unspecified atrioventricular block: Secondary | ICD-10-CM | POA: Diagnosis not present

## 2019-09-10 DIAGNOSIS — Z95828 Presence of other vascular implants and grafts: Secondary | ICD-10-CM | POA: Diagnosis not present

## 2019-09-10 DIAGNOSIS — D696 Thrombocytopenia, unspecified: Secondary | ICD-10-CM | POA: Diagnosis not present

## 2019-09-10 DIAGNOSIS — J90 Pleural effusion, not elsewhere classified: Secondary | ICD-10-CM | POA: Diagnosis not present

## 2019-09-10 DIAGNOSIS — J9690 Respiratory failure, unspecified, unspecified whether with hypoxia or hypercapnia: Secondary | ICD-10-CM | POA: Diagnosis not present

## 2019-09-10 DIAGNOSIS — I4891 Unspecified atrial fibrillation: Secondary | ICD-10-CM | POA: Diagnosis not present

## 2019-09-10 DIAGNOSIS — Y998 Other external cause status: Secondary | ICD-10-CM | POA: Diagnosis not present

## 2019-09-10 DIAGNOSIS — I4892 Unspecified atrial flutter: Secondary | ICD-10-CM | POA: Diagnosis not present

## 2019-09-10 DIAGNOSIS — J9811 Atelectasis: Secondary | ICD-10-CM | POA: Diagnosis not present

## 2019-09-10 DIAGNOSIS — J984 Other disorders of lung: Secondary | ICD-10-CM | POA: Diagnosis not present

## 2019-09-10 DIAGNOSIS — J9600 Acute respiratory failure, unspecified whether with hypoxia or hypercapnia: Secondary | ICD-10-CM | POA: Diagnosis not present

## 2019-09-10 DIAGNOSIS — Z4682 Encounter for fitting and adjustment of non-vascular catheter: Secondary | ICD-10-CM | POA: Diagnosis not present

## 2019-09-10 DIAGNOSIS — R000 Tachycardia, unspecified: Secondary | ICD-10-CM | POA: Diagnosis not present

## 2019-09-10 DIAGNOSIS — Z978 Presence of other specified devices: Secondary | ICD-10-CM | POA: Diagnosis not present

## 2019-09-10 DIAGNOSIS — A419 Sepsis, unspecified organism: Secondary | ICD-10-CM | POA: Diagnosis not present

## 2019-09-10 DIAGNOSIS — T1490XA Injury, unspecified, initial encounter: Secondary | ICD-10-CM | POA: Diagnosis not present

## 2019-09-10 DIAGNOSIS — S199XXA Unspecified injury of neck, initial encounter: Secondary | ICD-10-CM | POA: Diagnosis not present

## 2019-09-10 DIAGNOSIS — A4159 Other Gram-negative sepsis: Secondary | ICD-10-CM | POA: Diagnosis not present

## 2019-09-10 DIAGNOSIS — R9401 Abnormal electroencephalogram [EEG]: Secondary | ICD-10-CM | POA: Diagnosis not present

## 2019-09-12 MED ORDER — ROCURONIUM BROMIDE 50 MG/5ML IV SOLN
INTRAVENOUS | Status: AC | PRN
  Administered 2019-09-09: 70 mg via INTRAVENOUS

## 2019-09-12 MED ORDER — ETOMIDATE 2 MG/ML IV SOLN
INTRAVENOUS | Status: AC | PRN
  Administered 2019-09-09: 20 mg via INTRAVENOUS

## 2019-09-23 ENCOUNTER — Ambulatory Visit: Payer: Medicare HMO | Admitting: Internal Medicine

## 2019-10-26 ENCOUNTER — Telehealth: Payer: Self-pay | Admitting: Internal Medicine

## 2019-10-26 NOTE — Telephone Encounter (Signed)
    Patient's daughter John Dalton calling, states patient died 2019-10-15. She will be faxing form from [employer] to office for completion.  [name]'s phone [phone]

## 2019-10-26 NOTE — Telephone Encounter (Signed)
Sent to Dr. John. 

## 2019-10-27 NOTE — Telephone Encounter (Signed)
ok thanks

## 2019-10-29 DEATH — deceased

## 2019-11-03 NOTE — Telephone Encounter (Signed)
Forms have been received, Completed, Faxed, Copy sent to scan.

## 2020-12-16 IMAGING — CT CT ABD-PELV W/ CM
2 of 5 series · 16 of 46 positions shown, 18 images · IV contrast (omnipaque)
Comparison: None.

CLINICAL DATA: Left-sided abdominal pain. Nausea, vomiting,
diarrhea for 2 days. History of diverticulitis. Diverticulitis
suspected.

EXAM:
CT ABDOMEN AND PELVIS WITH CONTRAST
TECHNIQUE: Multidetector CT imaging of the abdomen and pelvis was performed
using the standard protocol following bolus administration of
intravenous contrast.
CONTRAST:  100mL OMNIPAQUE IOHEXOL 300 MG/ML  SOLN

[Series 2: axial st · axial · 0.91mm/px · z∈[-500,-115]mm · 13 of 89 slices shown, 15 images]
[im 6/89  soft-tissue]
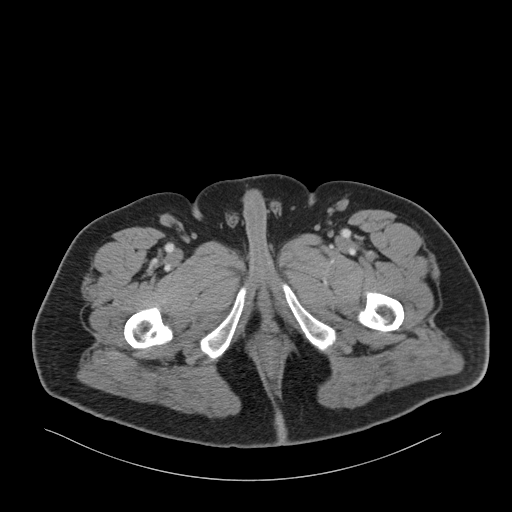
[im 6/89  bone]
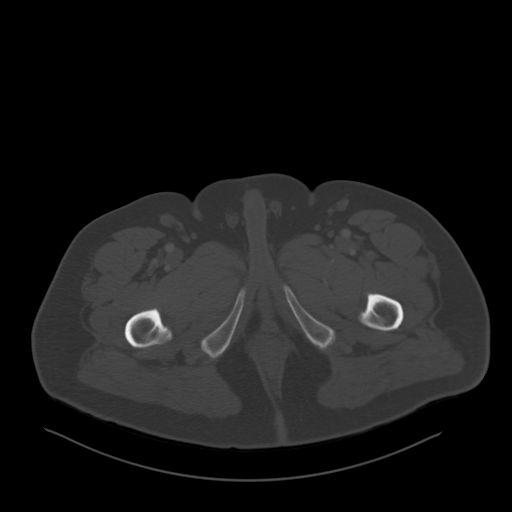
[im 12/89  soft-tissue]
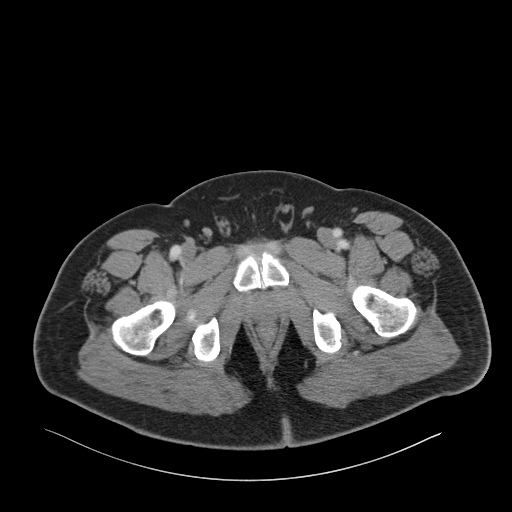
[im 18/89  soft-tissue]
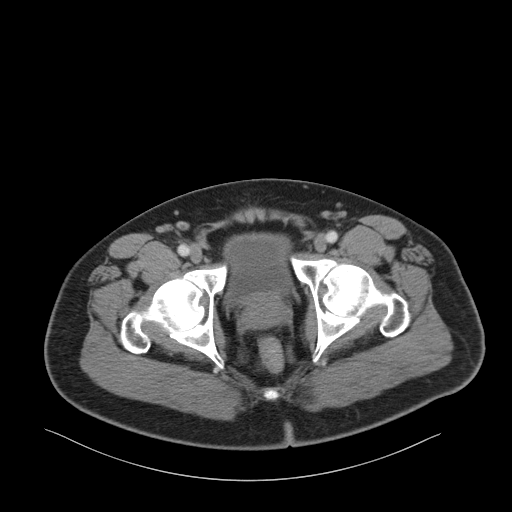
[im 24/89  soft-tissue]
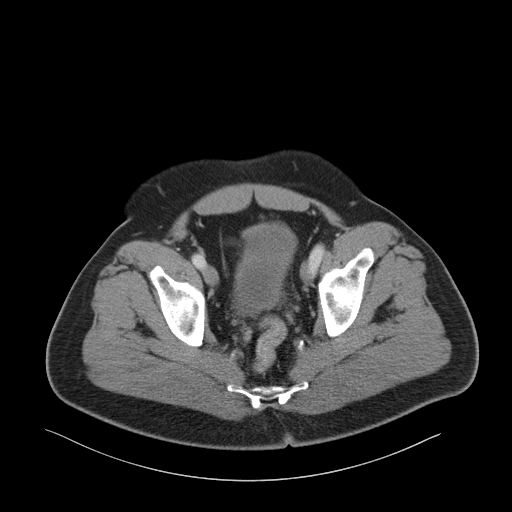
[im 30/89  soft-tissue]
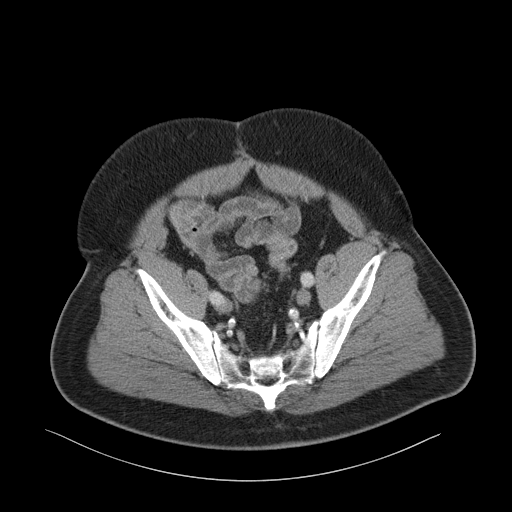
[im 36/89  soft-tissue]
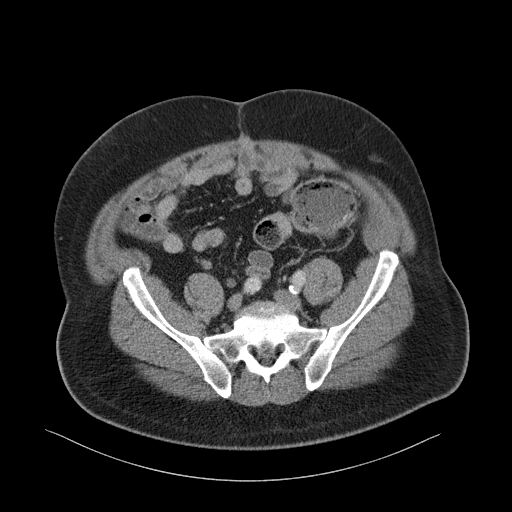
[im 47/89  soft-tissue]
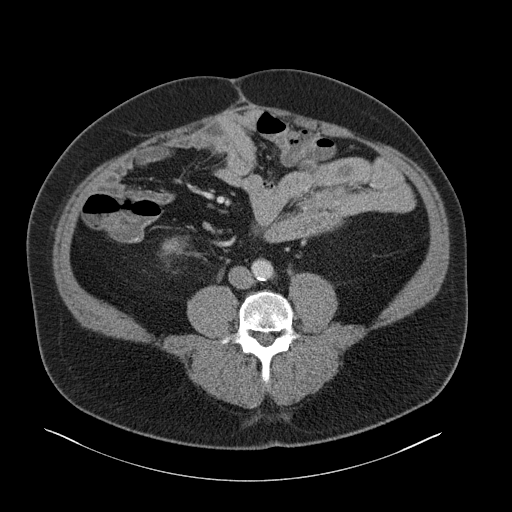
[im 53/89  soft-tissue]
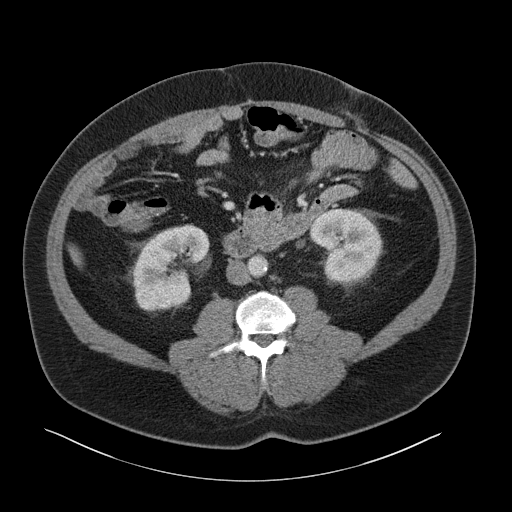
[im 59/89  soft-tissue]
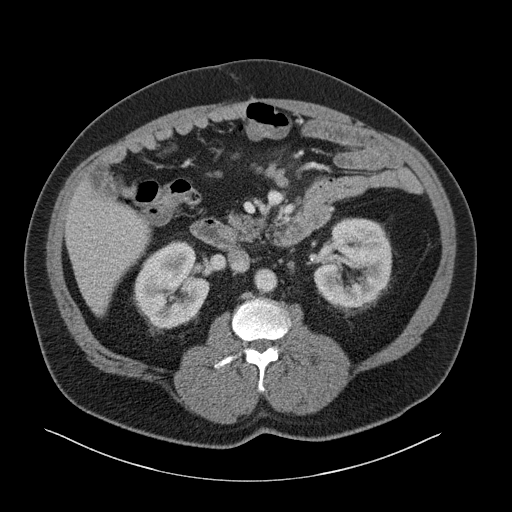
[im 59/89  bone]
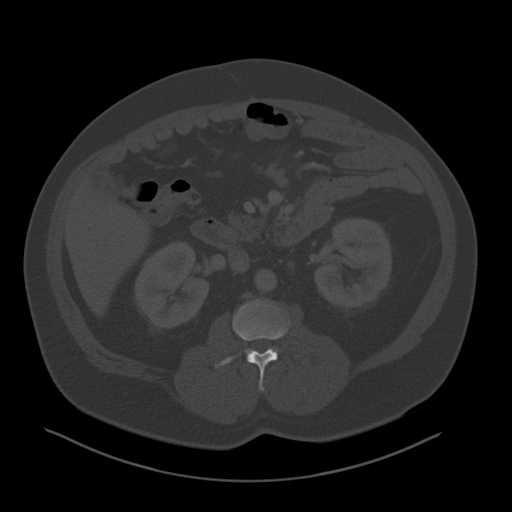
[im 65/89  soft-tissue]
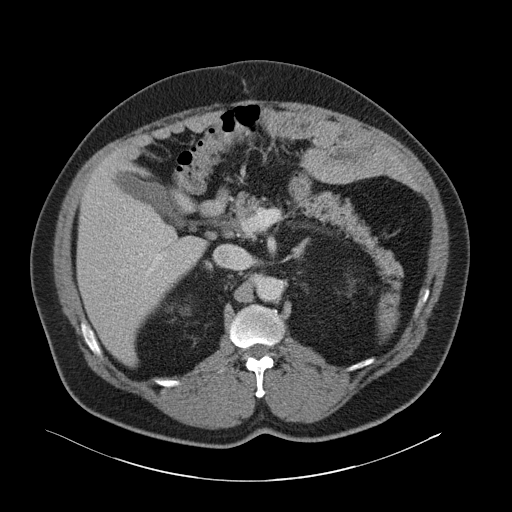
[im 71/89  soft-tissue]
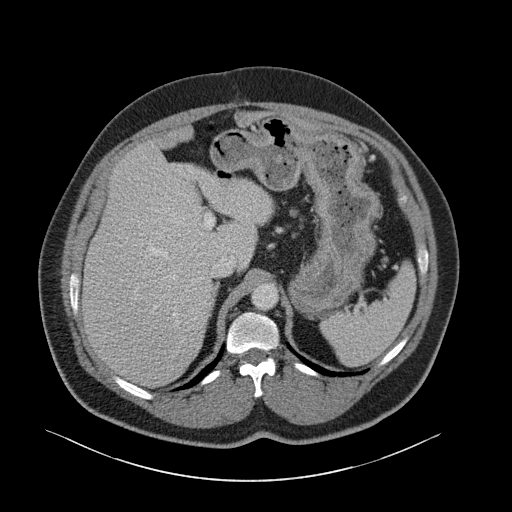
[im 77/89  soft-tissue]
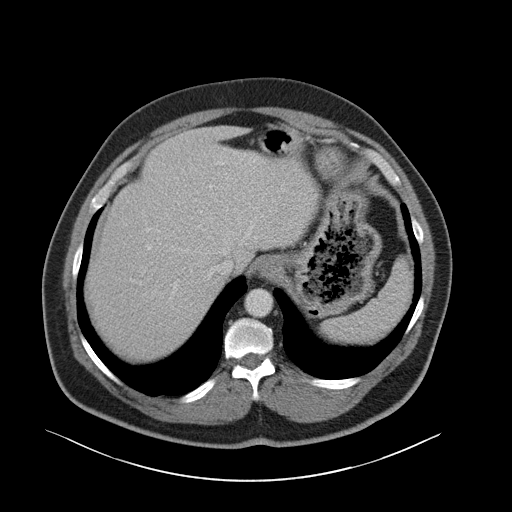
[im 83/89  soft-tissue]
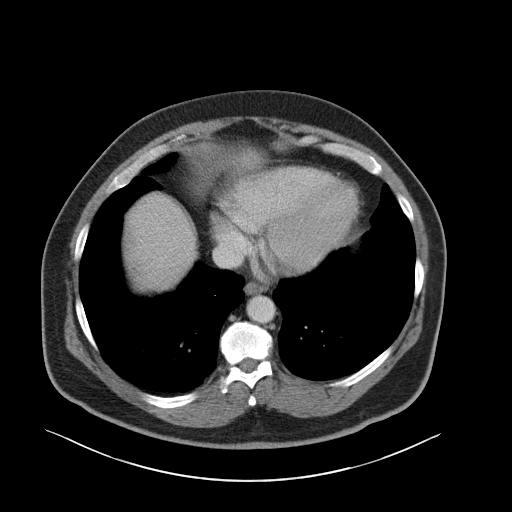

[Series 5: coronal st · coronal · 0.87mm/px · 3 of 170 slices shown]
[im 57/170  soft-tissue]
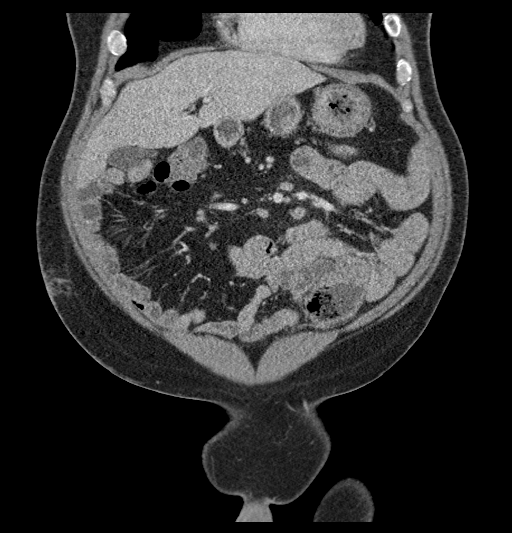
[im 76/170  soft-tissue]
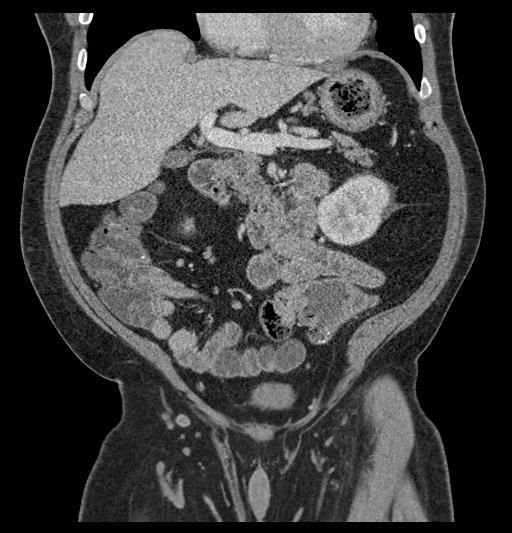
[im 94/170  soft-tissue]
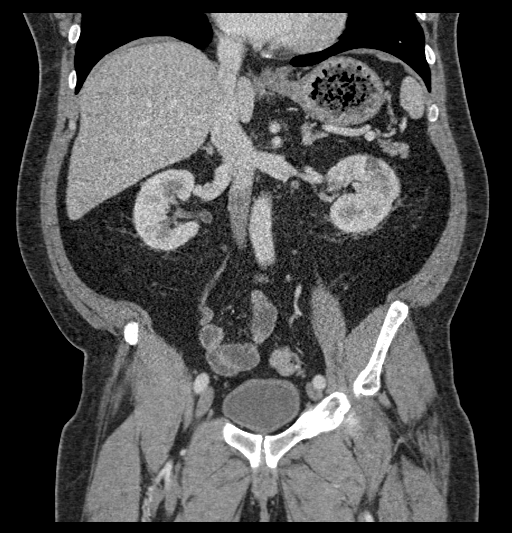

[16 of 46 positions shown; findings below may reference images not displayed]

FINDINGS: Lower chest: Hypoventilatory and subsegmental atelectasis at the
bases. No pleural fluid. Upper normal heart size.

Hepatobiliary: No focal liver abnormality is seen. No gallstones,
gallbladder wall thickening, or biliary dilatation.

Pancreas: No ductal dilatation or inflammation.

Spleen: Normal in size without focal abnormality. Probable splenule
anterior to the spleen.

Adrenals/Urinary Tract: No adrenal nodule. No hydronephrosis. There
is moderate but symmetric bilateral perinephric edema. Homogeneous
renal enhancement with symmetric excretion on delayed phase imaging.
13 mm cyst in the upper left kidney. Urinary bladder is partially
distended. Equivocal wall thickening.

Stomach/Bowel: Ingested material within the stomach. No evidence of
small bowel obstruction or inflammation. No terminal ileal
inflammation.

Appendix is prominent measuring 7-8 mm and contains intraluminal
appendicoliths. No periappendiceal fat stranding or inflammatory
change. There is liquid stool in the proximal colon. Partial
colectomy with enteric sutures in the sigmoid colon. Minimal sigmoid
diverticulosis without diverticulitis. There is no colonic wall
thickening or inflammation.

Vascular/Lymphatic: Mild aortic atherosclerosis. No aortic aneurysm.
Portal vein is patent. Few prominent central small bowel lymph nodes
with mesenteric edema.

Reproductive: Prostate is unremarkable.

Other: Fat in both inguinal canals. Postsurgical change of the
anterior abdominal wall. Patchy subcutaneous density in the right
lower anterior abdominal wall may be related to medication injection
sites. Sequela of prior descending ostomy in the left abdominal
wall. No free air, free fluid, or intra-abdominal fluid collection.

Musculoskeletal: There are no acute or suspicious osseous
abnormalities.
IMPRESSION: 1. Minimal sigmoid diverticulosis without diverticulitis.
2. Prominent central small bowel lymph nodes with mesenteric edema,
can be seen with mesenteric panniculitis, of unknown acuity.
3. Bilateral perinephric edema, typically chronic, however there is
mild bladder wall thickening. Recommend further evaluation with
urinalysis.
4. Mildly prominent appendix containing intraluminal appendicoliths,
however no evidence of appendicitis or periappendiceal fat
stranding. Presence of appendicolith may place patient at risk for
future appendicitis. Consider elective surgical referral.

Aortic Atherosclerosis ([id number]).
